# Patient Record
Sex: Female | Born: 2000 | Hispanic: Yes | Marital: Single | Smoking: Never smoker
Health system: Southern US, Community
[De-identification: ages and names within clinical notes are randomized; demographics above are authoritative.]

## PROBLEM LIST (undated history)

## (undated) DIAGNOSIS — F32A Depression, unspecified: Secondary | ICD-10-CM

## (undated) DIAGNOSIS — F909 Attention-deficit hyperactivity disorder, unspecified type: Secondary | ICD-10-CM

## (undated) DIAGNOSIS — T7840XA Allergy, unspecified, initial encounter: Secondary | ICD-10-CM

## (undated) DIAGNOSIS — F913 Oppositional defiant disorder: Secondary | ICD-10-CM

## (undated) DIAGNOSIS — N390 Urinary tract infection, site not specified: Secondary | ICD-10-CM

## (undated) DIAGNOSIS — F419 Anxiety disorder, unspecified: Secondary | ICD-10-CM

## (undated) DIAGNOSIS — J189 Pneumonia, unspecified organism: Secondary | ICD-10-CM

## (undated) DIAGNOSIS — J45909 Unspecified asthma, uncomplicated: Secondary | ICD-10-CM

## (undated) DIAGNOSIS — H539 Unspecified visual disturbance: Secondary | ICD-10-CM

## (undated) DIAGNOSIS — L309 Dermatitis, unspecified: Secondary | ICD-10-CM

## (undated) DIAGNOSIS — F4310 Post-traumatic stress disorder, unspecified: Secondary | ICD-10-CM

## (undated) DIAGNOSIS — R4689 Other symptoms and signs involving appearance and behavior: Secondary | ICD-10-CM

## (undated) HISTORY — DX: Dermatitis, unspecified: L30.9

## (undated) HISTORY — PX: TYMPANOSTOMY TUBE PLACEMENT: SHX32

## (undated) HISTORY — DX: Depression, unspecified: F32.A

## (undated) HISTORY — PX: ADENOIDECTOMY: SUR15

---
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
## 2021-02-21 ENCOUNTER — Other Ambulatory Visit: Payer: Self-pay | Admitting: Pediatrics

## 2021-02-21 DIAGNOSIS — L2084 Intrinsic (allergic) eczema: Secondary | ICD-10-CM

## 2021-02-22 ENCOUNTER — Encounter (HOSPITAL_COMMUNITY): Payer: Self-pay

## 2021-02-22 ENCOUNTER — Ambulatory Visit (HOSPITAL_COMMUNITY)
Admission: RE | Admit: 2021-02-22 | Discharge: 2021-02-22 | Disposition: A | Discharge status: Home / Self Care | Payer: Medicaid Other | Source: Ambulatory Visit | Attending: Family Medicine | Admitting: Family Medicine

## 2021-02-22 ENCOUNTER — Other Ambulatory Visit: Payer: Self-pay

## 2021-02-22 VITALS — BP 120/71 | HR 96 | Temp 99.4°F | Resp 18

## 2021-02-22 DIAGNOSIS — N939 Abnormal uterine and vaginal bleeding, unspecified: Secondary | ICD-10-CM | POA: Diagnosis not present

## 2021-02-22 LAB — POC URINE PREG, ED: Preg Test, Ur: NEGATIVE

## 2021-02-22 NOTE — ED Provider Notes (Signed)
[place]    CSN: [id number] Arrival date & time: 02/22/21  1903      History   Chief Complaint Chief Complaint  Patient presents with   Vaginal Bleeding    HPI Ariel Hernandez is a 21 y.o. female.    Vaginal Bleeding Here for 3 weeks of vaginal bleeding.  She had had her period start when she was still on the hormone pills in her pill pack.  She went 2 days without bleeding and then restarted.  She has been changing her pad 7 times a day or so.  She has felt dizzy a few times.  No syncope.  No fever or chills.  She has had some cramps in her lower abdomen.  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      Home Medications    Prior to Admission medications   Medication Sig Start Date End Date  Taking? Authorizing Provider  albuterol (PROAIR HFA) 108 (90 Base) MCG/ACT inhaler Inhale 2 puffs into the lungs every 4 (four) hours as needed (cough). 05/17/20   Mannie Stabile, MD  albuterol (PROVENTIL) (2.5 MG/3ML) 0.083% nebulizer solution Take 3 mLs (2.5 mg total) by nebulization every 4 (four) hours as needed for wheezing or shortness of breath. 06/04/20   Mannie Stabile, MD  busPIRone (BUSPAR) 15 MG tablet Take 15 mg by mouth 2 (two) times daily. 02/01/19   [provider]  cetirizine (ZYRTEC ALLERGY) 10 MG tablet Take 1 tablet (10 mg total) by mouth daily. 12/06/20   Jaynee Eagles, PA-C  ciprofloxacin-dexamethasone (CIPRODEX) OTIC suspension Place 4 drops into the right ear 2 (two) times daily. 12/06/20   Jaynee Eagles, PA-C  fluticasone (FLONASE) 50 MCG/ACT nasal spray Place 1 spray into both nostrils daily. 05/17/20   Mannie Stabile, MD  fluticasone (FLOVENT HFA) 110 MCG/ACT inhaler Inhale 2 puffs into the lungs 2 (two) times daily. USE WITH SPACER. 05/17/20   Mannie Stabile, MD  LAMICTAL 100 MG tablet Take 100 mg by mouth 2 (two) times daily. 02/01/19   [provider]  Levonorgestrel-Ethinyl Estradiol (CAMRESE) 0.15-0.03 &0.01 MG tablet Take 1 tablet by mouth daily. 05/10/20   Mannie Stabile, MD  loratadine (CLARITIN)  10 MG tablet Take 1 tablet (10 mg total) by mouth daily. 05/17/20   Mannie Stabile, MD  MIRALAX 17 GM/SCOOP powder Take 17 g by mouth daily. 02/01/19   [provider]  montelukast (SINGULAIR) 10 MG tablet Take 1 tablet (10 mg total) by mouth at bedtime. 05/17/20   Mannie Stabile, MD  pseudoephedrine (SUDAFED) 60 MG tablet Take 1 tablet (60 mg total) by mouth every 8 (eight) hours as needed for congestion. 12/06/20   Jaynee Eagles, PA-C  QUEtiapine (SEROQUEL) 200 MG tablet Take 200 mg by mouth in the morning.    [provider]  QUEtiapine (SEROQUEL) 400 MG tablet Take 400 mg by mouth at bedtime.    [provider]  sertraline  (ZOLOFT) 100 MG tablet Take 100 mg by mouth at bedtime.  12/11/15   [provider]  triamcinolone (KENALOG) 0.1 % APPLY TO AFFECTED AREA TWICE A DAY 01/02/20   Mannie Stabile, MD    Family History Family History  Adopted: Yes    Social History Social History   Tobacco Use   Smoking status: Never   Smokeless tobacco: Never  Vaping Use   Vaping Use: Never used  Substance Use Topics   Alcohol use: No   Drug use: No     Allergies   Fish allergy, Focalin [dexmethylphenidate], Hydrocortisone, and Other   Review of Systems Review of Systems  Genitourinary:  Positive for vaginal bleeding.    Physical Exam Triage Vital Signs ED Triage Vitals  Enc Vitals Group     BP 02/22/21 1931 120/71     Pulse Rate 02/22/21 1931 96     Resp 02/22/21 1931 18     Temp 02/22/21 1931 99.4 F (37.4 C)     Temp Source 02/22/21 1931 Oral     SpO2 02/22/21 1931 98 %     Weight --      Height --      Head Circumference --      Peak Flow --      Pain Score 02/22/21 1928 0     Pain Loc --      Pain Edu? --      Excl. in [place]? --    No data found.  Updated Vital Signs BP 120/71 (BP Location: Right Arm)    Pulse 96    Temp 99.4 F (37.4 C) (Oral)    Resp 18    LMP 01/31/2021 Comment: Abnormal bleeding 3 weeks   SpO2 98%   Visual Acuity Right Eye Distance:   Left Eye Distance:   Bilateral Distance:    Right Eye Near:   Left Eye Near:    Bilateral Near:     Physical Exam Vitals reviewed.  Constitutional:      General: She is not in acute distress.    Appearance: She is not toxic-appearing.  HENT:     Nose: Nose normal.     Mouth/Throat:     Mouth: Mucous membranes are moist.  Eyes:     Extraocular Movements: Extraocular movements intact.     Pupils: Pupils are equal, round, and reactive to light.  Cardiovascular:     Rate and Rhythm: Normal rate and regular rhythm.     Heart sounds: No murmur heard. Pulmonary:     Effort: Pulmonary effort is normal.     Breath  sounds: Normal breath sounds.  Abdominal:     Palpations: Abdomen is soft. There is no mass.  Tenderness: There is no abdominal tenderness.  Musculoskeletal:     Cervical back: Neck supple.  Skin:    Capillary Refill: Capillary refill takes less than 2 seconds.     Coloration: Skin is not jaundiced or pale.  Neurological:     General: No focal deficit present.  Psychiatric:        Behavior: Behavior normal.     UC Treatments / Results  Labs (all labs ordered are listed, but only abnormal results are displayed) Labs Reviewed  POC URINE PREG, ED    EKG   Radiology No results found.  Procedures Procedures (including critical care time)  Medications Ordered in UC Medications - No data to display  Initial Impression / Assessment and Plan / UC Course  I have reviewed the triage vital signs and the nursing notes.  Pertinent labs & imaging results that were available during my care of the patient were reviewed by me and considered in my medical decision making (see chart for details).     UPT is negative. Final Clinical Impressions(s) / UC Diagnoses   Final diagnoses:  None   Discharge Instructions   None    ED Prescriptions   None    PDMP not reviewed this encounter.   Barrett Henle, MD 02/22/21 2045

## 2021-02-22 NOTE — ED Triage Notes (Signed)
Pt presents with 3 weeks of abnormal vaginal bleeding.

## 2021-02-22 NOTE — Discharge Instructions (Addendum)
The urine pregnancy test was negative. Take a multivitamin with iron daily.  Make sure you are drinking enough fluids.

## 2021-03-10 ENCOUNTER — Encounter (HOSPITAL_COMMUNITY): Payer: Self-pay

## 2021-03-10 ENCOUNTER — Ambulatory Visit (HOSPITAL_COMMUNITY)
Admission: RE | Admit: 2021-03-10 | Discharge: 2021-03-10 | Disposition: A | Discharge status: Home / Self Care | Payer: Medicaid Other | Source: Ambulatory Visit | Attending: Emergency Medicine | Admitting: Emergency Medicine

## 2021-03-10 ENCOUNTER — Other Ambulatory Visit: Payer: Self-pay

## 2021-03-10 VITALS — BP 117/69 | HR 103 | Temp 98.1°F | Resp 20

## 2021-03-10 DIAGNOSIS — L309 Dermatitis, unspecified: Secondary | ICD-10-CM

## 2021-03-10 DIAGNOSIS — L2084 Intrinsic (allergic) eczema: Secondary | ICD-10-CM

## 2021-03-10 DIAGNOSIS — H66001 Acute suppurative otitis media without spontaneous rupture of ear drum, right ear: Secondary | ICD-10-CM | POA: Diagnosis not present

## 2021-03-10 DIAGNOSIS — B9689 Other specified bacterial agents as the cause of diseases classified elsewhere: Secondary | ICD-10-CM

## 2021-03-10 DIAGNOSIS — J020 Streptococcal pharyngitis: Secondary | ICD-10-CM

## 2021-03-10 DIAGNOSIS — J038 Acute tonsillitis due to other specified organisms: Secondary | ICD-10-CM | POA: Diagnosis not present

## 2021-03-10 MED ORDER — TRIAMCINOLONE ACETONIDE 0.1 % EX CREA: TOPICAL_CREAM | CUTANEOUS | 0 refills | Status: DC

## 2021-03-10 MED ORDER — CEFDINIR 300 MG PO CAPS: 300.0000 mg | ORAL_CAPSULE | Freq: Two times a day (BID) | ORAL | 0 refills | Status: AC

## 2021-03-10 NOTE — ED Provider Notes (Signed)
[place]    CSN: [id number] Arrival date & time: 03/10/21  1300    HISTORY   Chief Complaint  Patient presents with   appt 1   Sore Throat   Otalgia   HPI Ariel Hernandez is a 21 y.o. female. Patient complains of a 10-day history of sore throat and some right ear pain as well.  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  Patient states she has not noticed any improvement of her sore throat, denies difficulty swallowing but states that it is painful.  Patient is afebrile on arrival with a mildly elevated heart rate.  Patient reports a history of strep throat in the past.  Patient denies drainage from her right ear and hearing loss.  Patient states the ear hurts more when she tripped attempts to blow her nose but denies significant nasal congestion.  Patient denies cough.  Patient states that 8 days ago, she vomited once.  Patient states she has not had a headache.  The history is provided by the patient.  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    Home Medications    Prior to Admission medications   Medication Sig Start Date End Date Taking? Authorizing Provider  albuterol (PROAIR HFA) 108 (90 Base) MCG/ACT inhaler Inhale 2 puffs into the lungs every 4 (four) hours as needed (cough). 05/17/20   Mannie Stabile, MD  albuterol (PROVENTIL) (2.5 MG/3ML) 0.083% nebulizer solution Take 3 mLs (2.5 mg total) by nebulization every 4 (four) hours as needed for wheezing or shortness of breath. 06/04/20   Mannie Stabile, MD  busPIRone (BUSPAR) 15 MG tablet Take 15 mg by mouth 2 (two) times daily. 02/01/19   [provider]  cetirizine (ZYRTEC ALLERGY) 10 MG tablet Take 1 tablet (10 mg total) by mouth daily. 12/06/20   Jaynee Eagles, PA-C  fluticasone (FLONASE) 50 MCG/ACT nasal spray Place 1 spray into both nostrils daily. 05/17/20   Mannie Stabile, MD  fluticasone (FLOVENT HFA) 110 MCG/ACT inhaler Inhale 2 puffs into the lungs 2 (two) times daily. USE WITH SPACER. 05/17/20   Mannie Stabile, MD  LAMICTAL 100 MG tablet Take 100 mg by mouth 2 (two) times  daily. 02/01/19   [provider]  Levonorgestrel-Ethinyl Estradiol (CAMRESE) 0.15-0.03 &0.01 MG tablet Take 1 tablet by mouth daily. 05/10/20   Mannie Stabile, MD  loratadine (CLARITIN) 10 MG tablet Take 1 tablet (10 mg total) by mouth daily. 05/17/20   Mannie Stabile, MD  MIRALAX 17 GM/SCOOP powder Take 17 g by mouth daily. 02/01/19   [provider]  montelukast (SINGULAIR) 10 MG tablet Take 1 tablet (10 mg total) by mouth at bedtime. 05/17/20   Mannie Stabile, MD  pseudoephedrine (SUDAFED) 60 MG tablet Take 1 tablet (60 mg total) by mouth every 8 (eight) hours as needed for congestion. 12/06/20    Jaynee Eagles, PA-C  QUEtiapine (SEROQUEL) 200 MG tablet Take 200 mg by mouth in the morning.    [provider]  QUEtiapine (SEROQUEL) 400 MG tablet Take 400 mg by mouth at bedtime.    [provider]  sertraline (ZOLOFT) 100 MG tablet Take 100 mg by mouth at bedtime.  12/11/15   [provider]  triamcinolone (KENALOG) 0.1 % APPLY TO AFFECTED AREA TWICE A DAY 01/02/20   Mannie Stabile, MD   Family History Family History  Adopted: Yes   Social History Social History   Tobacco Use   Smoking status: Never   Smokeless tobacco: Never  Vaping Use   Vaping Use: Never used  Substance Use Topics   Alcohol use: No   Drug use: No   Allergies   Fish allergy, Focalin [dexmethylphenidate], Hydrocortisone, and Other  Review of Systems Review of Systems Pertinent findings noted in history of present illness.   Physical Exam Triage Vital Signs ED Triage Vitals  Enc Vitals Group     BP 11/16/20 0827 (!) 147/82     Pulse Rate 11/16/20 0827 72     Resp 11/16/20 0827 18     Temp 11/16/20 0827 98.3 F (36.8 C)     Temp Source 11/16/20 0827 Oral     SpO2 11/16/20 0827 98 %     Weight --      Height --      Head Circumference --      Peak Flow --      Pain Score 11/16/20 0826 5     Pain Loc --      Pain Edu? --      Excl. in [place]? --   No data found.  Updated Vital Signs BP 117/69 (BP Location: Left Arm)    Pulse (!) 103    Temp 98.1 F (36.7 C) (Oral)    Resp 20    SpO2 99%   Physical Exam Constitutional:      General: She is not in acute distress.    Appearance: She is well-developed. She is ill-appearing. She is not toxic-appearing.  HENT:     Head: Normocephalic and atraumatic.     Salivary Glands: Right salivary gland is diffusely enlarged and tender. Left salivary gland is diffusely enlarged and tender.     Right Ear: Hearing and external ear normal.     Left Ear: Hearing and external ear normal.     Ears:     Comments: Bilateral EACs with mild  erythema, left TM is normal in appearance, right TM bulging with purulent fluid.    Nose: No mucosal edema, congestion or rhinorrhea.     Right Turbinates: Not enlarged, swollen or pale.     Left Turbinates: Not enlarged or swollen.     Right Sinus: No maxillary  sinus tenderness or frontal sinus tenderness.     Left Sinus: No maxillary sinus tenderness or frontal sinus tenderness.     Mouth/Throat:     Lips: Pink. No lesions.     Mouth: Mucous membranes are moist. No oral lesions or angioedema.     Dentition: No gingival swelling.     Tongue: No lesions.     Palate: No mass.     Pharynx: Uvula midline. Pharyngeal swelling, oropharyngeal exudate and posterior oropharyngeal erythema present. No uvula swelling.     Tonsils: Tonsillar exudate present. 2+ on the right. 2+ on the left.  Eyes:     Extraocular Movements: Extraocular movements intact.     Conjunctiva/sclera: Conjunctivae normal.     Pupils: Pupils are equal, round, and reactive to light.  Neck:     Thyroid: No thyroid mass, thyromegaly or thyroid tenderness.     Trachea: Tracheal tenderness present. No abnormal tracheal secretions or tracheal deviation.     Comments: Voice is muffled Cardiovascular:     Rate and Rhythm: Normal rate and regular rhythm.     Pulses: Normal pulses.     Heart sounds: Normal heart sounds, S1 normal and S2 normal. No murmur heard.   No friction rub. No gallop.  Pulmonary:     Effort: Pulmonary effort is normal. No accessory muscle usage, prolonged expiration, respiratory distress or retractions.     Breath sounds: No stridor, decreased air movement or transmitted upper airway sounds. No decreased breath sounds, wheezing, rhonchi or rales.  Abdominal:     General: Bowel sounds are normal.     Palpations: Abdomen is soft.     Tenderness: There is generalized abdominal tenderness. There is no right CVA tenderness, left CVA tenderness or rebound. Negative signs include Murphy's sign.     Hernia: No  hernia is present.  Musculoskeletal:        General: No tenderness. Normal range of motion.     Cervical back: Full passive range of motion without pain, normal range of motion and neck supple.     Right lower leg: No edema.     Left lower leg: No edema.  Lymphadenopathy:     Cervical: Cervical adenopathy present.     Right cervical: Superficial cervical adenopathy present.     Left cervical: Superficial cervical adenopathy present.  Skin:    General: Skin is warm and dry.     Findings: No erythema, lesion or rash.  Neurological:     General: No focal deficit present.     Mental Status: She is alert and oriented to person, place, and time. Mental status is at baseline.  Psychiatric:        Mood and Affect: Mood normal.        Behavior: Behavior normal.        Thought Content: Thought content normal.        Judgment: Judgment normal.    Visual Acuity Right Eye Distance:   Left Eye Distance:   Bilateral Distance:    Right Eye Near:   Left Eye Near:    Bilateral Near:     UC Couse / Diagnostics / Procedures:    EKG  Radiology No results found.  Procedures Procedures (including critical care time)  UC Diagnoses / Final Clinical Impressions(s)   I have reviewed the triage vital signs and the nursing notes.  Pertinent labs & imaging results that were available during my care of the patient were reviewed by me and considered in  my medical decision making (see chart for details).   Final diagnoses:  Acute bacterial tonsillitis  Acute suppurative otitis media of right ear  Eczema, unspecified type  Streptococcal pharyngitis   Patient to begin cefdinir twice daily for 10 days.  Return precautions advised.  ED Prescriptions     Medication Sig Dispense Auth. Provider   triamcinolone cream (KENALOG) 0.1 % APPLY TO AFFECTED AREA TWICE A DAY 453.6 g Lynden Oxford Scales, PA-C   cefdinir (OMNICEF) 300 MG capsule Take 1 capsule (300 mg total) by mouth 2 (two) times daily  for 10 days. 20 capsule Lynden Oxford Scales, PA-C      PDMP not reviewed this encounter.  Pending results:  Labs Reviewed  POCT RAPID STREP A, ED / UC    Medications Ordered in UC: Medications - No data to display  Disposition Upon Discharge:  Condition: stable for discharge home Home: take medications as prescribed; routine discharge instructions as discussed; follow up as advised.  Patient presented with an acute illness with associated systemic symptoms and significant discomfort requiring urgent management. In my opinion, this is a condition that a prudent lay person (someone who possesses an average knowledge of health and medicine) may potentially expect to result in complications if not addressed urgently such as respiratory distress, impairment of bodily function or dysfunction of bodily organs.   Routine symptom specific, illness specific and/or disease specific instructions were discussed with the patient and/or caregiver at length.   As such, the patient has been evaluated and assessed, work-up was performed and treatment was provided in alignment with urgent care protocols and evidence based medicine.  Patient/parent/caregiver has been advised that the patient may require follow up for further testing and treatment if the symptoms continue in spite of treatment, as clinically indicated and appropriate.  If the patient was tested for COVID-19, Influenza and/or RSV, then the patient/parent/guardian was advised to isolate at home pending the results of his/her diagnostic coronavirus test and potentially longer if theyre positive. I have also advised pt that if his/her COVID-19 test returns positive, it's recommended to self-isolate for at least 10 days after symptoms first appeared AND until fever-free for 24 hours without fever reducer AND other symptoms have improved or resolved. Discussed self-isolation recommendations as well as instructions for household member/close  contacts as per the [facility] and [place] [facility], and also gave patient the [place] packet with this information.  Patient/parent/caregiver has been advised to return to the [facility] or PCP in 3-5 days if no better; to PCP or the Emergency Department if new signs and symptoms develop, or if the current signs or symptoms continue to change or worsen for further workup, evaluation and treatment as clinically indicated and appropriate  The patient will follow up with their current PCP if and as advised. If the patient does not currently have a PCP we will assist them in obtaining one.   The patient may need specialty follow up if the symptoms continue, in spite of conservative treatment and management, for further workup, evaluation, consultation and treatment as clinically indicated and appropriate.  Patient/parent/caregiver verbalized understanding and agreement of plan as discussed.  All questions were addressed during visit.  Please see discharge instructions below for further details of plan.  Discharge Instructions:   Discharge Instructions      Your rapid strep test today is positive. Because you also have an infected right eardrum, please begin cefdinir 300 mg twice daily for 10 days today.  A prescription has been sent  to your pharmacy.  Cefdinir will also treat you for bacterial pharyngitis including streptococcal pharyngitis.  Please take all doses as prescribed.    After taking antibiotics for 24 hours, you are no longer considered contagious and you should begin to feel much better.  Please follow-up with your primary care provider in the next week to ten days for repeat evaluation if you have not had complete resolution of your symptoms.   Conservative care is also recommended at this time.  This includes rest, pushing clear fluids and activity as tolerated.  Warm beverages such as teas and broths versus cold beverages/popsicles and frozen sherbet/sorbet are your choice, both warm and cold are  beneficial.  You may also notice that your appetite is reduced; this is okay as long as you are drinking plenty of clear fluids.   I have renewed your prescription for triamcinolone in the chart.  If you would like to mix it 50-50 with Eucerin original healing cream, please do so in the sterile cup provided with the sterile popsicle sticks that I gave you as well.   Please follow-up within the next 3 to 5 days either with your primary care provider or urgent care if your symptoms do not resolve.  If you do not have a primary care provider, we will assist you in finding one.       This office note has been dictated using Museum/gallery curator.  Unfortunately, and despite my best efforts, this method of dictation can sometimes lead to occasional typographical or grammatical errors.  I apologize in advance if this occurs.     Lynden Oxford Scales, PA-C 03/10/21 1515

## 2021-03-10 NOTE — ED Triage Notes (Signed)
Pt had sore throat, sore ears and some congestion for over a week. Reports right ear hurts worse than left.

## 2021-03-10 NOTE — Discharge Instructions (Addendum)
Your rapid strep test today is positive. Because you also have an infected right eardrum, please begin cefdinir 300 mg twice daily for 10 days today.  A prescription has been sent to your pharmacy.  Cefdinir will also treat you for bacterial pharyngitis including streptococcal pharyngitis.  Please take all doses as prescribed.    After taking antibiotics for 24 hours, you are no longer considered contagious and you should begin to feel much better.  Please follow-up with your primary care provider in the next week to ten days for repeat evaluation if you have not had complete resolution of your symptoms.   Conservative care is also recommended at this time.  This includes rest, pushing clear fluids and activity as tolerated.  Warm beverages such as teas and broths versus cold beverages/popsicles and frozen sherbet/sorbet are your choice, both warm and cold are beneficial.  You may also notice that your appetite is reduced; this is okay as long as you are drinking plenty of clear fluids.   I have renewed your prescription for triamcinolone in the chart.  If you would like to mix it 50-50 with Eucerin original healing cream, please do so in the sterile cup provided with the sterile popsicle sticks that I gave you as well.   Please follow-up within the next 3 to 5 days either with your primary care provider or urgent care if your symptoms do not resolve.  If you do not have a primary care provider, we will assist you in finding one.

## 2021-07-24 ENCOUNTER — Ambulatory Visit (INDEPENDENT_AMBULATORY_CARE_PROVIDER_SITE_OTHER): Payer: Medicare Other | Admitting: Student in an Organized Health Care Education/Training Program

## 2021-07-24 ENCOUNTER — Ambulatory Visit (INDEPENDENT_AMBULATORY_CARE_PROVIDER_SITE_OTHER): Payer: Medicare Other | Admitting: Clinical

## 2021-07-24 DIAGNOSIS — F319 Bipolar disorder, unspecified: Secondary | ICD-10-CM

## 2021-07-24 DIAGNOSIS — F4325 Adjustment disorder with mixed disturbance of emotions and conduct: Secondary | ICD-10-CM | POA: Diagnosis not present

## 2021-07-24 MED ORDER — LAMICTAL 100 MG PO TABS: 100.0000 mg | ORAL_TABLET | Freq: Two times a day (BID) | ORAL | 2 refills | Status: DC

## 2021-07-24 MED ORDER — BUSPIRONE HCL 15 MG PO TABS: 15.0000 mg | ORAL_TABLET | Freq: Every day | ORAL | 2 refills | Status: DC

## 2021-07-24 MED ORDER — SERTRALINE HCL 50 MG PO TABS: 50.0000 mg | ORAL_TABLET | Freq: Every day | ORAL | 2 refills | Status: DC

## 2021-07-24 MED ORDER — BUSPIRONE HCL 30 MG PO TABS: 30.0000 mg | ORAL_TABLET | Freq: Every day | ORAL | 2 refills | Status: DC

## 2021-07-24 MED ORDER — QUETIAPINE FUMARATE 200 MG PO TABS: 200.0000 mg | ORAL_TABLET | Freq: Every day | ORAL | 2 refills | Status: DC
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
## 2021-08-08 DIAGNOSIS — F3110 Bipolar disorder, current episode manic without psychotic features, unspecified: Secondary | ICD-10-CM | POA: Insufficient documentation

## 2021-08-08 DIAGNOSIS — F39 Unspecified mood [affective] disorder: Secondary | ICD-10-CM | POA: Insufficient documentation

## 2021-08-08 DIAGNOSIS — F4310 Post-traumatic stress disorder, unspecified: Secondary | ICD-10-CM | POA: Insufficient documentation

## 2021-08-20 ENCOUNTER — Encounter (HOSPITAL_COMMUNITY): Payer: Self-pay

## 2021-08-20 NOTE — Plan of Care (Signed)
  Problem: Depression CCP Problem  1  Goal: LTG: Scott WILL SCORE LESS THAN 10 ON THE PATIENT HEALTH QUESTIONNAIRE (PHQ-9) Outcome: Initial Goal: STG: Reduce overall depression score by a minimum of 25% on the Patient Health Questionnaire (PHQ-9) or the Montgomery-Asberg Depression Rating Scale (MADRS) Outcome: Initial

## 2021-08-26 ENCOUNTER — Encounter (HOSPITAL_COMMUNITY): Payer: Self-pay | Admitting: Student in an Organized Health Care Education/Training Program

## 2021-08-26 ENCOUNTER — Ambulatory Visit (INDEPENDENT_AMBULATORY_CARE_PROVIDER_SITE_OTHER): Payer: Medicare Other | Admitting: Student in an Organized Health Care Education/Training Program

## 2021-08-26 VITALS — BP 117/79 | HR 110 | Resp 92 | Wt 137.2 lb

## 2021-08-26 DIAGNOSIS — F319 Bipolar disorder, unspecified: Secondary | ICD-10-CM

## 2021-08-26 DIAGNOSIS — F4325 Adjustment disorder with mixed disturbance of emotions and conduct: Secondary | ICD-10-CM | POA: Diagnosis not present

## 2021-08-26 MED ORDER — QUETIAPINE FUMARATE 400 MG PO TABS: 400.0000 mg | ORAL_TABLET | Freq: Every day | ORAL | 0 refills | Status: DC

## 2021-08-26 MED ORDER — QUETIAPINE FUMARATE 100 MG PO TABS: 100.0000 mg | ORAL_TABLET | Freq: Two times a day (BID) | ORAL | 0 refills | Status: DC

## 2021-08-26 MED ORDER — BUSPIRONE HCL 15 MG PO TABS: 15.0000 mg | ORAL_TABLET | Freq: Every day | ORAL | 2 refills | Status: DC

## 2021-08-26 MED ORDER — LAMICTAL 100 MG PO TABS: 100.0000 mg | ORAL_TABLET | Freq: Two times a day (BID) | ORAL | 2 refills | Status: DC

## 2021-08-26 MED ORDER — QUETIAPINE FUMARATE 100 MG PO TABS: 100.0000 mg | ORAL_TABLET | Freq: Every day | ORAL | 0 refills | Status: DC

## 2021-08-26 MED ORDER — BUSPIRONE HCL 30 MG PO TABS: 30.0000 mg | ORAL_TABLET | Freq: Every day | ORAL | 2 refills | Status: DC

## 2021-08-26 NOTE — Progress Notes (Signed)
BH MD/PA/NP OP Progress Note  08/26/2021 3:25 PM Ariel Hernandez  MRN:  [id number]  Chief Complaint:  Chief Complaint  Patient presents with   Medication Refill   HPI: Ariel Hernandez is a 21 year old patient with a PPH of ADHD, anxiety, insomnia, intermittent explosive disorder, ODD, intellectual disability and bipolar disorder.  Patient reports she has been doing fairly well on a regimen of: Zoloft 50 mg, BuSpar 15 mg in the a.m./30 mg nightly, Seroquel 200 mg nightly, Lamictal 100 mg twice daily.  Patient reports that she is still having a bit of trouble falling asleep on this regimen endorsing that despite practicing good sleep hygiene, she still feels that her mind is preoccupied with numerous thoughts.  Patient reports that she feels that she may be worrying about a lot of things throughout the day.  Patient reports she is still feeling a bit overwhelmed by being her own guardian and she is still trying to get adjusted to this.  Patient reports she is trying to do her best to make sure that she remains stable.  Patient reports that she has seen a PCP to reestablish primary care.  Patient reports her appetite has been stable and that her PCP is addressing some gastrointestinal issues.  Patient reports that she is on disability and has been since childhood and qualified for SSI when she turned 19.  Patient reports that despite this she is looking forward to hearing back from drug rehabilitation so she can start working in some capacity.  Patient denies SI, HI and AVH. Patient did inform provider that she will be starting a new OCP and there was concern about starting this with her Lamictal.  Provider endorsed the patient should continue her Lamictal as it is. Visit Diagnosis:    ICD-10-CM   1. Bipolar affective disorder, remission status unspecified (HCC)  F31.9 busPIRone (BUSPAR) 15 MG tablet    busPIRone (BUSPAR) 30 MG tablet    LAMICTAL 100 MG tablet    QUEtiapine (SEROQUEL) 400 MG  tablet    QUEtiapine (SEROQUEL) 100 MG tablet    DISCONTINUED: QUEtiapine (SEROQUEL) 100 MG tablet    2. Adjustment disorder with mixed disturbance of emotions and conduct  F43.25 busPIRone (BUSPAR) 30 MG tablet      Past Psychiatric History:  Intellectual disability, intermittent explosive disorder, conduct disorder, oppositional defiant disorder, generalized anxiety disorder, adjustment disorder, bipolar disorder unspecified Patient has had prior outpatient psychiatric care with Lovina Reach in [facility] for most of her childhood, was also seen at [facility] at some point - Prior medications include clonidine 0.3 mg (for nightmares) along with current medication regimen, Seroquel 200 mg daily and 400 mg nightly, Lamictal 100 twice daily, Zoloft 100 mg (recently decreased to 50 mg), BuSpar 15 mg 3 times daily. - Patient was in appear TF in 2019-2020    Past Medical History:  Past Medical History:  Diagnosis Date   ADHD (attention deficit hyperactivity disorder)    Allergy    Seasonal and animals   Anxiety    Asthma    Depression    Phreesia 05/17/2020   Oppositional defiant behavior    Pneumonia    PTSD (post-traumatic stress disorder)    Urinary tract infection    hx;of only once   Vision abnormalities    Hx: wears glasses    Past Surgical History:  Procedure Laterality Date   ADENOIDECTOMY     TOOTH EXTRACTION N/A 10/11/2012   Procedure: EXTRACTIONS  TEETH 5, 12, 21, 28 ;  Surgeon: [name], DDS;  Location: [facility] OR;  Service: Oral Surgery;  Laterality: N/A;    Family Psychiatric History: Biological mother: Bipolar disorder and substance use disorder  Family History:  Family History  Adopted: Yes    Social History:  Social History   Socioeconomic History   Marital status: Single    Spouse name: Not on file   Number of children: Not on file   Years of education: Not on file   Highest education level: Not on file  Occupational History   Not on file   Tobacco Use   Smoking status: Never   Smokeless tobacco: Never  Vaping Use   Vaping Use: Never used  Substance and Sexual Activity   Alcohol use: No   Drug use: No   Sexual activity: Never    Comment: Heterosexual  Other Topics Concern   Not on file  Social History Narrative   Not on file   Social Determinants of Health   Financial Resource Strain: Not on file  Food Insecurity: Not on file  Transportation Needs: Not on file  Physical Activity: Not on file  Stress: Not on file  Social Connections: Not on file    Allergies:  Allergies  Allergen Reactions   Fish Allergy    Focalin [Dexmethylphenidate]    Hydrocortisone Rash   Other Diarrhea and Nausea And Vomiting    Fish, milk    Metabolic Disorder Labs: No results found for: "HGBA1C", "MPG" Lab Results  Component Value Date   PROLACTIN 5.5 10/02/2019   Lab Results  Component Value Date   CHOL 213 (H) 10/02/2019   TRIG 187 (H) 10/02/2019   HDL 46 10/02/2019   CHOLHDL 4.6 10/02/2019   VLDL 37 10/02/2019   LDLCALC 130 (H) 10/02/2019   Lab Results  Component Value Date   TSH 2.295 10/02/2019    Therapeutic Level Labs: No results found for: "LITHIUM" No results found for: "VALPROATE" No results found for: "CBMZ"  Current Medications: Current Outpatient Medications  Medication Sig Dispense Refill   QUEtiapine (SEROQUEL) 100 MG tablet Take 1 tablet (100 mg total) by mouth at bedtime. 6 tablet 0   albuterol (PROAIR HFA) 108 (90 Base) MCG/ACT inhaler Inhale 2 puffs into the lungs every 4 (four) hours as needed (cough). 36 g 5   albuterol (PROVENTIL) (2.5 MG/3ML) 0.083% nebulizer solution Take 3 mLs (2.5 mg total) by nebulization every 4 (four) hours as needed for wheezing or shortness of breath. 75 mL 2   busPIRone (BUSPAR) 15 MG tablet Take 1 tablet (15 mg total) by mouth daily. 30 tablet 2   busPIRone (BUSPAR) 30 MG tablet Take 1 tablet (30 mg total) by mouth at bedtime. 30 tablet 2   cetirizine (ZYRTEC  ALLERGY) 10 MG tablet Take 1 tablet (10 mg total) by mouth daily. 30 tablet 0   fluticasone (FLONASE) 50 MCG/ACT nasal spray Place 1 spray into both nostrils daily. 16 mL 11   fluticasone (FLOVENT HFA) 110 MCG/ACT inhaler Inhale 2 puffs into the lungs 2 (two) times daily. USE WITH SPACER. 12 g 5   LAMICTAL 100 MG tablet Take 1 tablet (100 mg total) by mouth 2 (two) times daily. 60 tablet 2   Levonorgestrel-Ethinyl Estradiol (CAMRESE) 0.15-0.03 &0.01 MG tablet Take 1 tablet by mouth daily. 91 tablet 3   loratadine (CLARITIN) 10 MG tablet Take 1 tablet (10 mg total) by mouth daily. 30 tablet 11   MIRALAX 17 GM/SCOOP powder Take 17 g by mouth daily.  montelukast (SINGULAIR) 10 MG tablet Take 1 tablet (10 mg total) by mouth at bedtime. 30 tablet 11   pseudoephedrine (SUDAFED) 60 MG tablet Take 1 tablet (60 mg total) by mouth every 8 (eight) hours as needed for congestion. 30 tablet 0   QUEtiapine (SEROQUEL) 400 MG tablet Take 1 tablet (400 mg total) by mouth at bedtime. 30 tablet 0   sertraline (ZOLOFT) 50 MG tablet Take 1 tablet (50 mg total) by mouth daily. 30 tablet 2   triamcinolone cream (KENALOG) 0.1 % APPLY TO AFFECTED AREA TWICE A DAY 453.6 g 0   No current facility-administered medications for this visit.     Musculoskeletal: Strength & Muscle Tone: within normal limits Gait & Station: normal Patient leans: N/A  Psychiatric Specialty Exam: Review of Systems  Psychiatric/Behavioral:  Positive for sleep disturbance. Negative for agitation, behavioral problems, hallucinations, self-injury and suicidal ideas.     Blood pressure 117/79, pulse (!) 110, resp. rate (!) 92, weight 137 lb 3.2 oz (62.2 kg).Body mass index is 27.7 kg/m.  General Appearance: Casual  Eye Contact:  Good  Speech:  Clear and Coherent  Volume:  Normal  Mood:  Euthymic  Affect:  Appropriate and Congruent  Thought Process:  Goal Directed  Orientation:  Full (Time, Place, and Person)  Thought Content: Logical    Suicidal Thoughts:  No  Homicidal Thoughts:  No  Memory:  Immediate;   Good Recent;   Good Remote;   Fair  Judgement:  Good  Insight:  Fair  Psychomotor Activity:  Normal  Concentration:  Concentration: Fair  Recall:  NA  Fund of Knowledge: Fair  Language: Good  Akathisia:  No    AIMS (if indicated): not done  Assets:  Communication Skills Desire for Improvement Housing Resilience Social Support  ADL's:  Intact  Cognition: WNL  Sleep:  Fair   Screenings: GAD-7    Flowsheet Row Clinical Support from 08/26/2021 in [facility] Counselor from 07/24/2021 in [facility]  Total GAD-7 Score 8 14      PHQ2-9    Flowsheet Row Counselor from 07/24/2021 in [facility] Office Visit from 05/17/2020 in [facility] Office Visit from 05/18/2019 in [facility]  PHQ-2 Total Score 0 0 1  PHQ-9 Total Score 1 2 6       Flowsheet Row ED from 03/10/2021 in [facility] at [facility] ED from 02/22/2021 in [facility] at [facility] ED from 12/06/2020 in [facility] at [facility] RISK CATEGORY No Risk No Risk No Risk        Assessment and Plan: Ariel Hernandez is a 21 year old patient with a PPH of ADHD, anxiety, insomnia, intermittent explosive disorder, ODD, intellectual disability and bipolar disorder.  Patient appears to be doing fairly well however she continues to have some issues with sleep.  Patient was previously on a much higher dose of Seroquel for mood regulation.  Patient also endorses some anxieties related to being her own guardian however she appears to have decent insight.  Patient also has enough foresight to recognize that her medical appointments may be somewhat burdensome and her older sister who transports her.  Patient and provider discussed that patient is currently in a place where she can do video visits, patient endorsed  that she would like to try this in order to help her sister.  Adjustment disorder with anxiety -Continue Zoloft 50 mg daily -  Continue BuSpar 15 mg in the a.m. and 30 mg nightly  Hx bipolar disorder - Increase Seroquel from 200 mg nightly to 300 mg x 5 days (patient reports having five 200 mg pills at home).  Then increase again to 400 mg nightly - Continue Lamictal 100 mg twice daily  Follow-up with patient in approximately 2 months.  Collaboration of Care: Collaboration of Care:   Patient/Guardian was advised Release of Information must be obtained prior to any record release in order to collaborate their care with an outside provider. Patient/Guardian was advised if they have not already done so to contact the registration department to sign all necessary forms in order for [place] to release information regarding their care.   Consent: Patient/Guardian gives verbal consent for treatment and assignment of benefits for services provided during this visit. Patient/Guardian expressed understanding and agreed to proceed.    Bobbye Morton, MD 08/26/2021, 3:25 PM

## 2021-09-19 ENCOUNTER — Encounter (HOSPITAL_COMMUNITY): Payer: Self-pay

## 2021-09-19 ENCOUNTER — Ambulatory Visit (HOSPITAL_COMMUNITY)
Admission: EM | Admit: 2021-09-19 | Discharge: 2021-09-19 | Disposition: A | Discharge status: Home / Self Care | Payer: Medicare Other | Attending: Family Medicine | Admitting: Family Medicine

## 2021-09-19 ENCOUNTER — Ambulatory Visit (INDEPENDENT_AMBULATORY_CARE_PROVIDER_SITE_OTHER): Payer: Medicare Other

## 2021-09-19 DIAGNOSIS — S93602A Unspecified sprain of left foot, initial encounter: Secondary | ICD-10-CM | POA: Diagnosis not present

## 2021-09-19 DIAGNOSIS — M25572 Pain in left ankle and joints of left foot: Secondary | ICD-10-CM

## 2021-09-19 DIAGNOSIS — M79672 Pain in left foot: Secondary | ICD-10-CM

## 2021-09-19 DIAGNOSIS — S93492A Sprain of other ligament of left ankle, initial encounter: Secondary | ICD-10-CM

## 2021-09-19 NOTE — Discharge Instructions (Signed)
If not allergic, you may use over the counter ibuprofen or acetaminophen as needed. ° °

## 2021-09-19 NOTE — ED Provider Notes (Signed)
[facility]   [id number] 09/19/21 Arrival Time: 1909  ASSESSMENT & PLAN:  1. Sprain of anterior talofibular ligament of left ankle, initial encounter   2. Foot sprain, left, initial encounter    I have personally viewed the imaging studies ordered this visit. No bony abnormalities on left foot/ankle films. Discussed with caregiver.  OTC ibuprofen. See AVS for d/c information.  Orders Placed This Encounter  Procedures   DG Foot Complete Left   DG Ankle Complete Left   Recommend:   Follow-up Information     [facility].   Why: If worsening or failing to improve as anticipated. Contact information: [address] [place] [address] [phone]               Reviewed expectations re: course of current medical issues. Questions answered. Outlined signs and symptoms indicating need for more acute intervention. Patient verbalized understanding. After Visit Summary given.  SUBJECTIVE: History from: patient and caregiver. Ariel Hernandez is a 21 y.o. female who reports pain to LEFT foot an ankle; reports stepping in to hole today; twisted; immediate pain, esp with attempted weight-bearing; now with swelling. No extremity sensation changes or weakness. Tylenol PTA; some help.  Past Surgical History:  Procedure Laterality Date   ADENOIDECTOMY     TOOTH EXTRACTION N/A 10/11/2012   Procedure: EXTRACTIONS  TEETH 5, 12, 21, 28 ;  Surgeon: [name], DDS;  Location: [facility] OR;  Service: Oral Surgery;  Laterality: N/A;    OBJECTIVE:  Vitals:   09/19/21 1925  BP: (!) 165/82  Pulse: (!) 112  Resp: 18  Temp: 98.1 F (36.7 C)  SpO2: 97%    Tachycardia noted.  General appearance: alert; no distress HEENT: [place]; AT Neck: supple with FROM Resp: unlabored respirations Extremities: LLE: warm with well perfused appearance; poorly localized moderate tenderness over left lateral ankle and proximal dorsal/lateral  foot; swelling: moderate; bruising: none; ankle ROM: limited by reported pain CV: brisk extremity capillary refill of LLE; 2+ DP pulse of LLE. Skin: warm and dry; no visible rashes Neurologic: will not attempt weight-bearing; normal sensation and strength of LLE Psychological: alert and cooperative; normal mood and affect  Imaging: DG Foot Complete Left  Result Date: 09/19/2021 CLINICAL DATA:  Trauma. EXAM: LEFT FOOT - COMPLETE 3+ VIEW COMPARISON:  None Available. FINDINGS: There is no evidence of fracture or dislocation. There is no evidence of arthropathy or other focal bone abnormality. Soft tissues are unremarkable. IMPRESSION: Negative. Electronically Signed   By: Darliss Cheney M.D.   On: 09/19/2021 19:58   DG Ankle Complete Left  Result Date: 09/19/2021 CLINICAL DATA:  Left ankle pain EXAM: LEFT ANKLE COMPLETE - 3+ VIEW COMPARISON:  None Available. FINDINGS: There is no evidence of fracture, dislocation, or joint effusion. There is no evidence of arthropathy or other focal bone abnormality. Soft tissues are unremarkable. IMPRESSION: Negative. Electronically Signed   By: [name] M.D.   On: 09/19/2021 19:58     Allergies  Allergen Reactions   Fish Allergy    Focalin [Dexmethylphenidate]    Hydrocortisone Rash   Other Diarrhea and Nausea And Vomiting    Fish, milk    Past Medical History:  Diagnosis Date   ADHD (attention deficit hyperactivity disorder)    Allergy    Seasonal and animals   Anxiety    Asthma    Depression    [employer]reesia 05/17/2020   Oppositional defiant behavior    Pneumonia  PTSD (post-traumatic stress disorder)    Urinary tract infection    hx;of only once   Vision abnormalities    Hx: wears glasses   Social History   Socioeconomic History   Marital status: Single    Spouse name: Not on file   Number of children: Not on file   Years of education: Not on file   Highest education level: Not on file  Occupational History   Not on file   Tobacco Use   Smoking status: Never   Smokeless tobacco: Never  Vaping Use   Vaping Use: Never used  Substance and Sexual Activity   Alcohol use: No   Drug use: No   Sexual activity: Never    Birth control/protection: Pill    Comment: Heterosexual  Other Topics Concern   Not on file  Social History Narrative   Not on file   Social Determinants of Health   Financial Resource Strain: Not on file  Food Insecurity: Not on file  Transportation Needs: Not on file  Physical Activity: Not on file  Stress: Not on file  Social Connections: Not on file   Family History  Adopted: Yes   Past Surgical History:  Procedure Laterality Date   ADENOIDECTOMY     TOOTH EXTRACTION N/A 10/11/2012   Procedure: EXTRACTIONS  TEETH 5, 12, 21, 28 ;  Surgeon: [name], DDS;  Location: [facility] OR;  Service: Oral Surgery;  Laterality: N/AMardella Layman, MD 09/19/21 2016

## 2021-09-19 NOTE — ED Triage Notes (Signed)
Pt states stepped wrong in a ditch twisting lt and hurting lt foot. Swelling noted to top of lt foot. Pt states tool tylenol with some relief.

## 2021-10-01 ENCOUNTER — Ambulatory Visit (INDEPENDENT_AMBULATORY_CARE_PROVIDER_SITE_OTHER): Payer: Medicare Other | Admitting: Clinical

## 2021-10-01 DIAGNOSIS — F319 Bipolar disorder, unspecified: Secondary | ICD-10-CM | POA: Diagnosis not present

## 2021-10-01 NOTE — Progress Notes (Signed)
THERAPIST PROGRESS NOTE Virtual Visit via Video Note  I connected with Ariel Hernandez on 10/01/21 at  8:00 AM EDT by a video enabled telemedicine application and verified that I am speaking with the correct person using two identifiers.  Location: Patient: home Provider: office   I discussed the limitations of evaluation and management by telemedicine and the availability of in person appointments. The patient expressed understanding and agreed to proceed.   Follow Up Instructions: I discussed the assessment and treatment plan with the patient. The patient was provided an opportunity to ask questions and all were answered. The patient agreed with the plan and demonstrated an understanding of the instructions.   The patient was advised to call back or seek an in-person evaluation if the symptoms worsen or if the condition fails to improve as anticipated.    Session Time: 40 minutes  Participation Level: Active  Behavioral Response: CasualAlertEuthymic  Type of Therapy: Individual Therapy  Treatment Goals addressed: client will reduce overall depression score by a minimum of 25% on the PHQ9  ProgressTowards Goals: Progressing  Interventions: CBT and Supportive  Summary:  Ariel Hernandez is a 21 y.o. female who presents for the scheduled appointment oriented x5, appropriately dressed, and friendly.  Client denied hallucinations and delusions. Client reported on today she is doing pretty well.  Client reported since she was last seen things have been going well at the house.  Client reported she has been helping with doing chores around the house and with her niece and nephew.  Client reported her sister and brother-in-law have been very supportive in helping her to create structure and independence for herself.  Client reported they are positively reinforcing that 1 day she will be living on her own and knowing how to have a daily routine and maintain employment.  Client  reported her sister is helping her to look for work, teacher what pain client will be like, and creating a daily routine.  Client reported they are also want to help her learn how to make simple meals for herself and she can help cook at least once per week.  Client reported she is expressed some thoughts and feelings about feeling scared learning how to do more adult activities.  Client reported to goals for herself and learning how to reduce worry and improve her self-esteem. Client reported she is still meeting with her post release officer and she is set to be finished with that in March 2024.  Client reported she spends her time at home by herself because her sister and brother-in-law are working she does not have transportation of her own.  Client reported her sister has helped her to connect with vocational rehab to help with career assisted services but it has been a month that they have been reaching out and have not heard back from anyone.  Client reported her sister will help her look at jobs that she can apply for.  Client reported she is responding well to medication management and is eating and sleeping well. Evidence of progress towards goal:  client reported she practice problem solving skills at least 2x per week figuring out how to apply for employment. Client reported she is also working on maintaining a daily routine 7 out of 7 days per week.   Suicidal/Homicidal: Nowithout intent/plan  Therapist Response:  Therapist began the appointment asking the client how she has been doing since last seen. Therapist used CBT to engage using active listening and positive emotional  support. Therapist used CBT to ask the client about how she is managing daily activity on her own. Therapist used CBT to discuss how to maintain a routine and gain independence/ self esteem. Therapist used CBT ask the client to identify her progress with frequency of use with coping skills with continued practice in her  daily activity.    Therapist gave the client community resource information for supportive career services.     Plan: Return again in 4 weeks.  Diagnosis: bipolar affective disorder, remission status unspecified  Collaboration of Care: [employer]) AEB therapist gave the client information to [employer] for career supported service.  Patient/Guardian was advised Release of Information must be obtained prior to any record release in order to collaborate their care with an outside provider. Patient/Guardian was advised if they have not already done so to contact the registration department to sign all necessary forms in order for [place] to release information regarding their care.   Consent: Patient/Guardian gives verbal consent for treatment and assignment of benefits for services provided during this visit. Patient/Guardian expressed understanding and agreed to proceed.   Neena Rhymes Viridiana Spaid, LCSW 10/01/2021

## 2021-10-06 NOTE — Plan of Care (Signed)
  Problem: Depression CCP Problem  1  Goal: LTG: Kana WILL SCORE LESS THAN 10 ON THE PATIENT HEALTH QUESTIONNAIRE (PHQ-9) Outcome: Progressing Goal: STG: Reduce overall depression score by a minimum of 25% on the Patient Health Questionnaire (PHQ-9) or the Montgomery-Asberg Depression Rating Scale (MADRS) Outcome: Progressing

## 2021-10-15 ENCOUNTER — Ambulatory Visit (INDEPENDENT_AMBULATORY_CARE_PROVIDER_SITE_OTHER): Payer: Medicare Other | Admitting: Clinical

## 2021-10-15 DIAGNOSIS — F319 Bipolar disorder, unspecified: Secondary | ICD-10-CM

## 2021-10-15 NOTE — Progress Notes (Signed)
THERAPIST PROGRESS NOTE Virtual Visit via Video Note  I connected with Ariel Hernandez on 10/15/21 at  8:00 AM EDT by a video enabled telemedicine application and verified that I am speaking with the correct person using two identifiers.  Location: Patient: home Provider: office   I discussed the limitations of evaluation and management by telemedicine and the availability of in person appointments. The patient expressed understanding and agreed to proceed.   Follow Up Instructions: I discussed the assessment and treatment plan with the patient. The patient was provided an opportunity to ask questions and all were answered. The patient agreed with the plan and demonstrated an understanding of the instructions.   The patient was advised to call back or seek an in-person evaluation if the symptoms worsen or if the condition fails to improve as anticipated.   Session Time: 35 minutes  Participation Level: Active  Behavioral Response: CasualAlertEuthymic  Type of Therapy: Individual Therapy  Treatment Goals addressed: client will score less than 10 on the PHQ9  ProgressTowards Goals: Progressing  Interventions: CBT and Supportive  Summary:  Ariel Hernandez is a 21 y.o. female who presents for scheduled appointment oriented x5, appropriately dressed, and friendly.  Client denied hallucinations and delusions. Client reported on today she has been doing well.  Client reported she has a very good relationship with her mother and sister-in-law whom she is staying with and she is progressively opening up and talking to him more about things that bother her over she feels insecure about.  Client reported one of the things that she has discussed with them is going to get better at receiving constructive criticism.  Client reported her feelings get easily her and she does not want to be so emotional to receiving feedback.  Client reported otherwise she gave her sister the information to  [facility] from the last session and they will contact them to ask about the different programs.  Client reported being on post release has not limited her in some aspects but she would like some place to go where she can get social interaction and make friends.  Client reported she also wants to work sooner than later.  Client reported in regards to her parents she wants to have a good relationship with them but she feels like they do not want to have one with her own when they do talk to her it does not seem genuine.  Client reported part of her motivation is 1 and improve her parents longer at least 2 do not think that she is capable of doing better.  Client reported her sister told her she has to focus on her own motivation to improve and not showing people they were wrong.  Evidence of progress towards goal: Client reported she stays active by doing chores around the house at least 5 out of 7 days a week.  Client reported she is medication compliant 7 days out of the week.  Client reported 1 do believe that supports depression. Flowsheet Row Counselor from 10/15/2021 in [facility]  PHQ-9 Total Score 6        Suicidal/Homicidal: Nowithout intent/plan  Therapist Response:  Therapist began the appointment asking the client how she has been doing since last seen. Therapist used CBT to engage using active listening and positive emotional support. Therapist used CBT to engage the client and ask her about points of concern that she has been thinking about. Therapist used CBT to discuss cognitive patterns  and reframing thoughts pertaining to receiving constructive criticism from others. Therapist used CBT to reinforce the clients personal goals of working towards employment and independence. Therapist used CBT ask the client to identify her progress with frequency of use with coping skills with continued practice in her daily activity.    Therapist assigned client  homework to practice positive self talk in a way that builds her confidence and motivation to do better for herself.   Plan: Return again in 4 weeks.  Diagnosis: bipolar affective disorder, remission status unspecified  Collaboration of Care: Patient refused AEB none requested by the client at this time.  Patient/Guardian was advised Release of Information must be obtained prior to any record release in order to collaborate their care with an outside provider. Patient/Guardian was advised if they have not already done so to contact the registration department to sign all necessary forms in order for [place] to release information regarding their care.   Consent: Patient/Guardian gives verbal consent for treatment and assignment of benefits for services provided during this visit. Patient/Guardian expressed understanding and agreed to proceed.   [place]bert, LCSW 10/15/2021

## 2021-10-18 NOTE — Plan of Care (Signed)
  Problem: Depression CCP Problem  1  Goal: LTG: [place] WILL SCORE LESS THAN 10 ON THE PATIENT HEALTH QUESTIONNAIRE (PHQ-9) Outcome: Progressing Goal: STG: Reduce overall depression score by a minimum of 25% on the Patient Health Questionnaire (PHQ-9) or the Montgomery-Asberg Depression Rating Scale (MADRS) Outcome: Progressing   

## 2021-10-28 ENCOUNTER — Telehealth (INDEPENDENT_AMBULATORY_CARE_PROVIDER_SITE_OTHER): Payer: Medicare Other | Admitting: Student in an Organized Health Care Education/Training Program

## 2021-10-28 ENCOUNTER — Encounter (HOSPITAL_COMMUNITY): Payer: Self-pay | Admitting: Student in an Organized Health Care Education/Training Program

## 2021-10-28 DIAGNOSIS — F319 Bipolar disorder, unspecified: Secondary | ICD-10-CM

## 2021-10-28 DIAGNOSIS — F4325 Adjustment disorder with mixed disturbance of emotions and conduct: Secondary | ICD-10-CM

## 2021-10-28 MED ORDER — BUSPIRONE HCL 30 MG PO TABS: 30.0000 mg | ORAL_TABLET | Freq: Every day | ORAL | 2 refills | Status: DC

## 2021-10-28 MED ORDER — BUSPIRONE HCL 15 MG PO TABS: 15.0000 mg | ORAL_TABLET | Freq: Every day | ORAL | 2 refills | Status: DC

## 2021-10-28 MED ORDER — LAMICTAL 100 MG PO TABS: 100.0000 mg | ORAL_TABLET | Freq: Two times a day (BID) | ORAL | 2 refills | Status: DC

## 2021-10-28 MED ORDER — QUETIAPINE FUMARATE 400 MG PO TABS: 400.0000 mg | ORAL_TABLET | Freq: Every day | ORAL | 3 refills | Status: DC

## 2021-10-28 MED ORDER — SERTRALINE HCL 50 MG PO TABS: 50.0000 mg | ORAL_TABLET | Freq: Every day | ORAL | 2 refills | Status: DC

## 2021-10-28 NOTE — Progress Notes (Signed)
BH MD/PA/NP OP Progress Note  10/28/2021 4:35 PM Ariel Hernandez  MRN:  [id number]  Chief Complaint:  Chief Complaint  Patient presents with   Follow-up   HPI: Virtual Visit via Video Note  I connected with Ariel Hernandez on 10/28/21 at  3:00 PM EDT by a video enabled telemedicine application and verified that I am speaking with the correct person using two identifiers.  Location: Patient: Home, Alone Provider: Office   I discussed the limitations of evaluation and management by telemedicine and the availability of in person appointments. The patient expressed understanding and agreed to proceed.  History of Present Illness: Ariel Hernandez is a 21 year old patient with a PPH of ADHD, anxiety, insomnia, intermittent explosive disorder, ODD, intellectual disability and bipolar disorder.  Patient reports she is compliant with the following medication regimen:  Seroquel 400 mg nightly Lamictal 100 mg twice daily Zoloft 50 mg daily BuSpar 15 mg in the a.m. and 30 mg nightly  Patient reports she is not having any adverse side effects from her current medication regimen.  Patient reports she continues to live with her sister who is previously her guardian.  Patient reports that they continue to have a good relationship.  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  Patient reports she is also looking to get more social interaction by volunteering.  Patient reports that she still finds it difficult to be her own legal guardian, but ask her sister for help when she needs it.  Patient does endorse that her sister is giving her some independence allowing patient to figure things out on her own, "unless I really need them."  Patient reports that sometimes the task of being her own legal guardian leads to her feeling more anxious however she endorses using her "coping skills" including listening to music when she  becomes anxious.  Patient reports that she feels the Seroquel significantly helps ensure that she sleeps at night however she continues to have occasional nights where it is hard for her to fall and stay asleep.  Patient denies having racing thoughts at these times and endorses that generally she would just feel a bit more tired during the day.  Patient reports she has not been having any issues with irritability and reports that overall her mood has been "good."  Patient reports she does not have any specific concerns and does not believe her sister would have any additional concerns as her sister has told her that she believes she is doing fairly well.  Patient denies SI, HI and AVH.    I discussed the assessment and treatment plan with the patient. The patient was provided an opportunity to ask questions and all were answered. The patient agreed with the plan and demonstrated an understanding of the instructions.   The patient was advised to call back or seek an in-person evaluation if the symptoms worsen or if the condition fails to improve as anticipated.  I provided 20 minutes of non-face-to-face time during this encounter.  PGY-3 Ariel Morton, MD  Visit Diagnosis:    ICD-10-CM   1. Bipolar affective disorder, remission status unspecified (HCC)  F31.9 QUEtiapine (SEROQUEL) 400 MG tablet    LAMICTAL 100 MG tablet    busPIRone (BUSPAR) 15 MG tablet    busPIRone (BUSPAR) 30 MG tablet    sertraline (ZOLOFT) 50 MG tablet    2. Adjustment disorder with mixed disturbance  of emotions and conduct  F43.25 busPIRone (BUSPAR) 30 MG tablet      Past Psychiatric History: Intellectual disability, intermittent explosive disorder, conduct disorder, oppositional defiant disorder, generalized anxiety disorder, adjustment disorder, bipolar disorder unspecified Patient has had prior outpatient psychiatric care with Ariel Hernandez in [facility] for most of her childhood, was also seen at [facility] at  some point - Prior medications include clonidine 0.3 mg (for nightmares) along with current medication regimen, Seroquel 200 mg daily and 400 mg nightly, Lamictal 100 twice daily, Zoloft 100 mg (recently decreased to 50 mg), BuSpar 15 mg 3 times daily. - Patient was in [facility] in 2019-2020   08/2021: Patient Seroquel titrated up from 200 mg to 300 mg and then 400 mg nightly.  All other medications continued at previous dosage.  Patient was having anxiety about being her own guardian and difficulty sleeping, as well as numerous thoughts in her mind that were concerning to her.  Past Medical History:  Past Medical History:  Diagnosis Date   ADHD (attention deficit hyperactivity disorder)    Allergy    Seasonal and animals   Anxiety    Asthma    Depression    Phreesia 05/17/2020   Oppositional defiant behavior    Pneumonia    PTSD (post-traumatic stress disorder)    Urinary tract infection    hx;of only once   Vision abnormalities    Hx: wears glasses    Past Surgical History:  Procedure Laterality Date   ADENOIDECTOMY     TOOTH EXTRACTION N/A 10/11/2012   Procedure: EXTRACTIONS  TEETH 5, 12, 21, 28 ;  Surgeon: Gae Bon, DDS;  Location: [place];  Service: Oral Surgery;  Laterality: N/A;    Family Psychiatric History: Biological mother: Bipolar disorder and substance use disorder  Family History:  Family History  Adopted: Yes    Social History:  Social History   Socioeconomic History   Marital status: Significant Other    Spouse name: Not on file   Number of children: Not on file   Years of education: Not on file   Highest education level: Not on file  Occupational History   Not on file  Tobacco Use   Smoking status: Never   Smokeless tobacco: Never  Vaping Use   Vaping Use: Never used  Substance and Sexual Activity   Alcohol use: No   Drug use: No   Sexual activity: Never    Birth control/protection: Pill    Comment: Heterosexual  Other Topics Concern    Not on file  Social History Narrative   Not on file   Social Determinants of Health   Financial Resource Strain: Not on file  Food Insecurity: Not on file  Transportation Needs: Not on file  Physical Activity: Not on file  Stress: Not on file  Social Connections: Not on file    Allergies:  Allergies  Allergen Reactions   Fish Allergy    Focalin [Dexmethylphenidate]    Hydrocortisone Rash   Other Diarrhea and Nausea And Vomiting    Fish, milk    Metabolic Disorder Labs: No results found for: "HGBA1C", "MPG" Lab Results  Component Value Date   PROLACTIN 5.5 10/02/2019   Lab Results  Component Value Date   CHOL 213 (H) 10/02/2019   TRIG 187 (H) 10/02/2019   HDL 46 10/02/2019   CHOLHDL 4.6 10/02/2019   VLDL 37 10/02/2019   LDLCALC 130 (H) 10/02/2019   Lab Results  Component Value Date  TSH 2.295 10/02/2019    Therapeutic Level Labs: No results found for: "LITHIUM" No results found for: "VALPROATE" No results found for: "CBMZ"  Current Medications: Current Outpatient Medications  Medication Sig Dispense Refill   albuterol (PROAIR HFA) 108 (90 Base) MCG/ACT inhaler Inhale 2 puffs into the lungs every 4 (four) hours as needed (cough). 36 g 5   albuterol (PROVENTIL) (2.5 MG/3ML) 0.083% nebulizer solution Take 3 mLs (2.5 mg total) by nebulization every 4 (four) hours as needed for wheezing or shortness of breath. 75 mL 2   busPIRone (BUSPAR) 15 MG tablet Take 1 tablet (15 mg total) by mouth daily. 30 tablet 2   busPIRone (BUSPAR) 30 MG tablet Take 1 tablet (30 mg total) by mouth at bedtime. 30 tablet 2   cetirizine (ZYRTEC ALLERGY) 10 MG tablet Take 1 tablet (10 mg total) by mouth daily. 30 tablet 0   fluticasone (FLONASE) 50 MCG/ACT nasal spray Place 1 spray into both nostrils daily. 16 mL 11   fluticasone (FLOVENT HFA) 110 MCG/ACT inhaler Inhale 2 puffs into the lungs 2 (two) times daily. USE WITH SPACER. 12 g 5   LAMICTAL 100 MG tablet Take 1 tablet (100 mg  total) by mouth 2 (two) times daily. 60 tablet 2   Levonorgestrel-Ethinyl Estradiol (CAMRESE) 0.15-0.03 &0.01 MG tablet Take 1 tablet by mouth daily. 91 tablet 3   loratadine (CLARITIN) 10 MG tablet Take 1 tablet (10 mg total) by mouth daily. 30 tablet 11   MIRALAX 17 GM/SCOOP powder Take 17 g by mouth daily.     montelukast (SINGULAIR) 10 MG tablet Take 1 tablet (10 mg total) by mouth at bedtime. 30 tablet 11   pseudoephedrine (SUDAFED) 60 MG tablet Take 1 tablet (60 mg total) by mouth every 8 (eight) hours as needed for congestion. 30 tablet 0   QUEtiapine (SEROQUEL) 400 MG tablet Take 1 tablet (400 mg total) by mouth at bedtime. 30 tablet 3   sertraline (ZOLOFT) 50 MG tablet Take 1 tablet (50 mg total) by mouth daily. 30 tablet 2   triamcinolone cream (KENALOG) 0.1 % APPLY TO AFFECTED AREA TWICE A DAY 453.6 g 0   No current facility-administered medications for this visit.     Musculoskeletal: Defer  Psychiatric Specialty Exam: Review of Systems  Psychiatric/Behavioral:  Positive for sleep disturbance. Negative for agitation, behavioral problems, decreased concentration, dysphoric mood, hallucinations and suicidal ideas.     There were no vitals taken for this visit.There is no height or weight on file to calculate BMI.  General Appearance: Casual  Eye Contact:  Fair  Speech:  Clear and Coherent  Volume:  Normal  Mood:  Euthymic  Affect:  Appropriate  Thought Process:  Goal Directed  Orientation:  Full (Time, Place, and Person)  Thought Content: Logical   Suicidal Thoughts:  No  Homicidal Thoughts:  No  Memory:  Immediate;   Good Recent;   Good  Judgement:  Good  Insight:  Good  Psychomotor Activity:  Normal  Concentration:  Concentration: Fair  Recall:  Fair  Fund of Knowledge: Fair  Language: Good  Akathisia:  NA  Handed:    AIMS (if indicated): not done  Assets:  Communication Skills Desire for Improvement Financial Resources/Insurance Housing Resilience Social  Support  ADL's:  Intact  Cognition: Impaired,  Mild  Sleep: Fair   Screenings: GAD-7    Flowsheet Row Clinical Support from 08/26/2021 in [facility] Counselor from 07/24/2021 in [facility]  Total GAD-7 Score 8 14      PHQ2-9    Flowsheet Row Counselor from 10/15/2021 in [facility] Counselor from 07/24/2021 in [facility] Office Visit from 05/17/2020 in [facility] Office Visit from 05/18/2019 in [facility]  PHQ-2 Total Score 2 0 0 1  PHQ-9 Total Score 6 1 2 6       Flowsheet Row ED from 09/19/2021 in [facility] ED from 03/10/2021 in [facility] at [facility] ED from 02/22/2021 in [facility] at [facility] RISK CATEGORY No Risk No Risk No Risk        Assessment and Plan: Aritzel Woehrle is a 21 year old patient with a PPH of ADHD, anxiety, insomnia, intermittent explosive disorder, ODD, intellectual disability and bipolar disorder.  Patient appears to be doing fairly well at current medication dosing regimen.  Patient continues to have residual anxiety however endorses successful use of coping skills and on assessment today displayed very good insight and judgment.  Patient is benefited by living in a supportive environment that also provides stability.    Adjustment disorder with anxiety -Continue Zoloft 50 mg daily - Continue BuSpar 15 mg in the a.m. and 30 mg nightly  Hx bipolar disorder - Continue Seroquel 400 mg nightly - Continue Lamictal 100 mg twice daily  Follow-up with patient approximately 2 months  Collaboration of Care: Collaboration of Care:   Patient/Guardian was advised Release of Information must be obtained prior to any record release in order to collaborate their care with an outside provider. Patient/Guardian was advised if they have not already done so to contact  the registration department to sign all necessary forms in order for 26 to release information regarding their care.   Consent: Patient/Guardian gives verbal consent for treatment and assignment of benefits for services provided during this visit. Patient/Guardian expressed understanding and agreed to proceed.   PGY-3 [place], MD 10/28/2021, 4:35 PM

## 2021-10-30 ENCOUNTER — Encounter: Payer: Self-pay | Admitting: Advanced Practice Midwife

## 2021-10-30 ENCOUNTER — Ambulatory Visit (INDEPENDENT_AMBULATORY_CARE_PROVIDER_SITE_OTHER): Payer: Medicare Other | Admitting: Advanced Practice Midwife

## 2021-10-30 VITALS — BP 110/70 | HR 91 | Ht 59.0 in | Wt 146.0 lb

## 2021-10-30 DIAGNOSIS — N946 Dysmenorrhea, unspecified: Secondary | ICD-10-CM

## 2021-10-30 DIAGNOSIS — N921 Excessive and frequent menstruation with irregular cycle: Secondary | ICD-10-CM

## 2021-10-30 NOTE — Progress Notes (Signed)
21 y.o New GYN presents to establish care.  C/o irregular, heavy, painful periods 10/10, lasting 7+ days. She is currently on OCP to manage her periods.

## 2021-10-30 NOTE — Progress Notes (Signed)
   GYNECOLOGY PROGRESS NOTE  History:  21 y.o. G0P0000 presents to [place] office today for problem gyn visit. She reports painful heavy periods since menarche, improved but not resolved with OCPs. She was on an extended dosing regimen with Camrese but when she reported continuing to have pain with periods, even though they are less frequent, she was switched to a 28 day pill. She has not started the new pill and is not sure that is what she wants to do.  She prefers less frequent periods.  She   She denies h/a, dizziness, shortness of breath, n/v, or fever/chills.    The following portions of the patient's history were reviewed and updated as appropriate: allergies, current medications, past family history, past medical history, past social history, past surgical history and problem list.   Health Maintenance Due  Topic Date Due   HIV Screening  Never done   Hepatitis C Screening  Never done   INFLUENZA VACCINE  08/20/2021     Review of Systems:  Pertinent items are noted in HPI.   Objective:  Physical Exam Blood pressure 110/70, pulse 91, height 4\' 11"  (1.499 m), weight 146 lb (66.2 kg), last menstrual period 07/19/2021. VS reviewed, nursing note reviewed,  Constitutional: well developed, well nourished, no distress HEENT: normocephalic CV: normal rate Pulm/chest wall: normal effort Breast Exam: deferred Abdomen: soft Neuro: alert and oriented x 3 Skin: warm, dry Psych: affect normal Pelvic exam: Cervix pink, visually closed, without lesion, scant white creamy discharge, vaginal walls and external genitalia normal Bimanual exam: Cervix 0/long/high, firm, anterior, neg CMT, uterus nontender, nonenlarged, adnexa without tenderness, enlargement, or mass  Assessment & Plan:  1. Menorrhagia with irregular cycle --Pt with likely endometriosis, based on long hx painful periods. --Will renew Camrese, extended dose OCP, but recommend continuous dosing, for pt to skip the placebo  pills and begin next pack after 84 active pills.  --This continuous dose regimen is medically indicated given pt extreme pain with each menstrual cycle.  Switching to a 28 day pill is not recommended.    - Levonorgestrel-Ethinyl Estradiol (CAMRESE) 0.15-0.03 &0.01 MG tablet; Take 1 tablet by mouth daily.  Dispense: 91 tablet; Refill: 3  2. Dysmenorrhea --This is one or more chronic illnesses with exacerbation, progression, or side effect of treatment.  - Levonorgestrel-Ethinyl Estradiol (CAMRESE) 0.15-0.03 &0.01 MG tablet; Take 1 tablet by mouth daily.  Dispense: 91 tablet; Refill: 3   Return in about 3 months (around 01/30/2022) for After Jan 18 for Pap/follow up with me.   Fatima Blank, CNM 10:17 AM

## 2021-11-03 ENCOUNTER — Other Ambulatory Visit: Payer: Self-pay | Admitting: Advanced Practice Midwife

## 2021-11-03 DIAGNOSIS — N921 Excessive and frequent menstruation with irregular cycle: Secondary | ICD-10-CM

## 2021-11-03 DIAGNOSIS — N946 Dysmenorrhea, unspecified: Secondary | ICD-10-CM

## 2021-11-03 MED ORDER — LEVONORGEST-ETH ESTRAD 91-DAY 0.15-0.03 &0.01 MG PO TABS: 1.0000 | ORAL_TABLET | Freq: Every day | ORAL | 5 refills | Status: DC

## 2021-11-03 MED ORDER — LEVONORGEST-ETH ESTRAD 91-DAY 0.15-0.03 &0.01 MG PO TABS: 1.0000 | ORAL_TABLET | Freq: Every day | ORAL | 3 refills | Status: DC

## 2021-11-06 ENCOUNTER — Other Ambulatory Visit (HOSPITAL_BASED_OUTPATIENT_CLINIC_OR_DEPARTMENT_OTHER): Payer: Self-pay | Admitting: Advanced Practice Midwife

## 2021-11-06 DIAGNOSIS — N921 Excessive and frequent menstruation with irregular cycle: Secondary | ICD-10-CM

## 2021-11-06 MED ORDER — LEVONORGESTREL-ETHINYL ESTRAD 0.1-20 MG-MCG PO TABS: 1.0000 | ORAL_TABLET | Freq: Every day | ORAL | 15 refills | Status: DC

## 2021-11-07 ENCOUNTER — Telehealth: Payer: Self-pay

## 2021-11-07 NOTE — Telephone Encounter (Signed)
-----   Message from Elvera Maria, CNM sent at 11/06/2021  7:36 PM EDT ----- Regarding: RE: Prior auth may be needed Blase Beckner,  Well that is frustrating.  If insurance won't cover an extended regimen pill, she can take a regular 28 day pill continuously. We talked about this during her visit.  I will send her an Rx for a similar medication, but one that has 28 pills.  She should take the 21 pills, skip the placebo ones, and start the next pack. She can do this continuously, or stop and take the 7 placebo pills anytime and have a period.  Can you talk with her about this?  Let me know if there are any more insurance issues.  Thank you!  Lattie Haw  ----- Message ----- From: Lucianne Lei, RN Sent: 11/06/2021   1:26 PM EDT To: Elvera Maria, CNM Subject: RE: Prior Josem Kaufmann may be needed                   Efraim Kaufmann,  I called the patients pharmacy and the pharmacist stated that the patient's insurance completely rejected the rx for Camrese. She states that the insurance did not even give an option to do a PA to get it approved.  Sudeep Scheibel, RN ----- Message ----- From: Elvera Maria, CNM Sent: 11/03/2021  10:21 AM EDT To: Gerrianne Scale [place] Clinical [facility] Subject: Prior auth may be needed                       I wrote Rx for Camrese, an extended dose OCP, on 10/29/21, but her insurance offered alternatives that would not work. I wrote in my  note that I want her on an extended regimen, and I want her to take it continuously (not take placebo pills) due to her symptoms.  She has Medicare due to disability and secondary Medicaid.  If she needs a prior authorization, I can sign it.  Can you check with her pharmacy?  Thank you!

## 2021-11-07 NOTE — Telephone Encounter (Signed)
Left HIPPA compliant vm for patient to return call to the office.

## 2021-12-02 ENCOUNTER — Ambulatory Visit (INDEPENDENT_AMBULATORY_CARE_PROVIDER_SITE_OTHER): Payer: Medicare Other | Admitting: Clinical

## 2021-12-02 DIAGNOSIS — F319 Bipolar disorder, unspecified: Secondary | ICD-10-CM | POA: Diagnosis not present

## 2021-12-10 NOTE — Progress Notes (Signed)
THERAPIST PROGRESS NOTE Virtual Visit via Video Note  I connected with Ariel Hernandez on 12/02/2021 at  4:00 PM EST by a video enabled telemedicine application and verified that I am speaking with the correct person using two identifiers.  Location: Patient: home Provider: office   I discussed the limitations of evaluation and management by telemedicine and the availability of in person appointments. The patient expressed understanding and agreed to proceed.   Follow Up Instructions: I discussed the assessment and treatment plan with the patient. The patient was provided an opportunity to ask questions and all were answered. The patient agreed with the plan and demonstrated an understanding of the instructions.   The patient was advised to call back or seek an in-person evaluation if the symptoms worsen or if the condition fails to improve as anticipated.   Session Time: 35 minutes  Participation Level: Active  Behavioral Response: CasualAlertEuthymic  Type of Therapy: Individual Therapy  Treatment Goals addressed: client will reduce overall depression score by 25% on the PHQ-9  ProgressTowards Goals: Progressing  Interventions: CBT  Summary:  Ariel Hernandez is a 21 y.o. female who presents for the scheduled appointment oriented times five, appropriately dressed, and friendly. Client denied hallucinations and delusions. Client reported on today she is doing well. Client reported she has been working with her sister to apply for jobs. Client reported she is unable to work with [employer] because clients have to be 44 years old and up. Client reported she has been doing applications independently. Client reported she is still working on some life skills such cooking. Client reported she is preparing for spending thanksgiving with her family. Client reported her sister is going to her husband side of the family and she will get sent to her grandparents house and she will  also see her parents. Client reported her sister told her to spend her time at her grandma house and not linger around their parents if they become irritable or if she doesn't feel comfortable. Client reported she wants to have a good relationship with her parents buy by history she does not feel that they want the same.  Evidence of progress towards goal:  client reported reframing negative thoughts at least 4 days per week.  Suicidal/Homicidal: Nowithout intent/plan  Therapist Response:  Therapist began the appointment asking the client how she has been doing since last seen. Therapist used CBT to engage using active listening and positive emotional support. Therapist used CBT to engage and ask the client how she is working through employment opportunities. Therapist used CBT to positively reinforce her problem solving skills with employment. Therapist used CBT to discuss navigating family dynamic and communication. Therapist used CBT ask the client to identify her progress with frequency of use with coping skills with continued practice in her daily activity.    Therapist assigned the client homework to practice self care.    Plan: Return again in 3 weeks.  Diagnosis: bipolar affective disorder, remission status unspecified  Collaboration of Care: Patient refused AEB none requested by the client.  Patient/Guardian was advised Release of Information must be obtained prior to any record release in order to collaborate their care with an outside provider. Patient/Guardian was advised if they have not already done so to contact the registration department to sign all necessary forms in order for [place] to release information regarding their care.   Consent: Patient/Guardian gives verbal consent for treatment and assignment of benefits for services provided during this visit.  Patient/Guardian expressed understanding and agreed to proceed.   Neena Rhymes Dany Walther, LCSW 12/10/2021

## 2021-12-10 NOTE — Plan of Care (Signed)
  Problem: Depression CCP Problem  1  Goal: LTG: Calinda WILL SCORE LESS THAN 10 ON THE PATIENT HEALTH QUESTIONNAIRE (PHQ-9) Outcome: Progressing Goal: STG: Reduce overall depression score by a minimum of 25% on the Patient Health Questionnaire (PHQ-9) or the Montgomery-Asberg Depression Rating Scale (MADRS) Outcome: Progressing

## 2021-12-17 ENCOUNTER — Ambulatory Visit (INDEPENDENT_AMBULATORY_CARE_PROVIDER_SITE_OTHER): Payer: Medicare Other | Admitting: Clinical

## 2021-12-17 DIAGNOSIS — F319 Bipolar disorder, unspecified: Secondary | ICD-10-CM | POA: Diagnosis not present

## 2021-12-23 NOTE — Plan of Care (Signed)
  Problem: Depression CCP Problem  1  Goal: LTG: Takiesha WILL SCORE LESS THAN 10 ON THE PATIENT HEALTH QUESTIONNAIRE (PHQ-9) Outcome: Progressing Goal: STG: Reduce overall depression score by a minimum of 25% on the Patient Health Questionnaire (PHQ-9) or the Montgomery-Asberg Depression Rating Scale (MADRS) Outcome: Progressing   

## 2021-12-23 NOTE — Progress Notes (Signed)
THERAPIST PROGRESS NOTE Virtual Visit via Video Note  I connected with Ariel Hernandez on 12/17/2021 at  3:00 PM EST by a video enabled telemedicine application and verified that I am speaking with the correct person using two identifiers.  Location: Patient: home Provider: office   I discussed the limitations of evaluation and management by telemedicine and the availability of in person appointments. The patient expressed understanding and agreed to proceed.   Follow Up Instructions: I discussed the assessment and treatment plan with the patient. The patient was provided an opportunity to ask questions and all were answered. The patient agreed with the plan and demonstrated an understanding of the instructions.   The patient was advised to call back or seek an in-person evaluation if the symptoms worsen or if the condition fails to improve as anticipated.   Session Time: 30 minutes  Participation Level: Active  Behavioral Response: CasualAlertEuthymic  Type of Therapy: Individual Therapy  Treatment Goals addressed: client will reduce overall depression score by a minimum of 25% on the PHQ9  ProgressTowards Goals: Progressing  Interventions: CBT and Supportive  Summary:  Ariel Hernandez is a 21 y.o. female who presents for the scheduled appointment oriented times five, appropriately dressed, and friendly. Client denied hallucinations and delusions. Client reported she is doing good on today. Client reported thanksgiving went as well as it could have been expected with her parents. Client reported she was not able to sleep at her grandma house because of sleeping issues. Client reported her dad was "okay" for a few days but he eventually reverted back to his usual behavior. Client reported her dad has chronic pain and so he projects it into hateful comments towards her. Client reported he is addiment about telling her even when he is not in pain he still means the things he  days to her. Client reported her feelings get hurt but she cannot walk away from him when he is saying these things. Client reported she cannot walk away because in the past they have called the cops to falsely accuse her of running away when accurately she was trying to cool off for a minute. Client reported her family just tells her "you know how he gets". Client reported she does not like how that is a excuse. Client reported overall she held up well. Client reported otherwise things are well at home with her and her sisters family. Client reported she is continuing to apply for a child. Evidence of progress towards goal:  client reported 1 positive of using reframing thoughts and positive communication in her difficult family dynamic.   Suicidal/Homicidal: Nowithout intent/plan  Therapist Response:  Therapist began the appointment asking the client how she has been doing since last seen. Therapist used CBT to engage using active listening and positive emotional support. Therapist used CBT to ask open ended questions about how she interacted with her family during a stressful anticipated event. Therapist used CBT to reinforce the clients use of positive coping skills such as communication and emotional boundaries. Therapist used CBT ask the client to identify her progress with frequency of use with coping skills with continued practice in her daily activity.    Therapist assigned the client homework to practice self care.   Plan: Return again in 3 weeks.  Diagnosis:   bipolar affective disorder, remission unspecified  Collaboration of Care: Patient refused AEB none requested by the client.  Patient/Guardian was advised Release of Information must be obtained prior to any record  release in order to collaborate their care with an outside provider. Patient/Guardian was advised if they have not already done so to contact the registration department to sign all necessary forms in order for [place] to  release information regarding their care.   Consent: Patient/Guardian gives verbal consent for treatment and assignment of benefits for services provided during this visit. Patient/Guardian expressed understanding and agreed to proceed.   Neena Rhymes Damyen Knoll, LCSW 12/17/2021

## 2021-12-31 ENCOUNTER — Telehealth (INDEPENDENT_AMBULATORY_CARE_PROVIDER_SITE_OTHER): Payer: Medicare Other | Admitting: Student in an Organized Health Care Education/Training Program

## 2021-12-31 DIAGNOSIS — F319 Bipolar disorder, unspecified: Secondary | ICD-10-CM | POA: Diagnosis not present

## 2021-12-31 DIAGNOSIS — F4325 Adjustment disorder with mixed disturbance of emotions and conduct: Secondary | ICD-10-CM | POA: Diagnosis not present

## 2021-12-31 MED ORDER — QUETIAPINE FUMARATE 400 MG PO TABS: 400.0000 mg | ORAL_TABLET | Freq: Every day | ORAL | 3 refills | Status: DC

## 2021-12-31 MED ORDER — BUSPIRONE HCL 15 MG PO TABS: 15.0000 mg | ORAL_TABLET | Freq: Every day | ORAL | 2 refills | Status: DC

## 2021-12-31 MED ORDER — BUSPIRONE HCL 30 MG PO TABS: 30.0000 mg | ORAL_TABLET | Freq: Every day | ORAL | 2 refills | Status: DC

## 2021-12-31 MED ORDER — HYDROXYZINE HCL 25 MG PO TABS: 25.0000 mg | ORAL_TABLET | Freq: Two times a day (BID) | ORAL | 1 refills | Status: DC

## 2021-12-31 MED ORDER — LAMICTAL 100 MG PO TABS: 100.0000 mg | ORAL_TABLET | Freq: Two times a day (BID) | ORAL | 3 refills | Status: DC

## 2021-12-31 NOTE — Patient Instructions (Addendum)
Medication Changes  Stop Zoloft 50mg  daily due to concern for stomach bleed, this medication could worsen it . Do not throw them away, but do not take them right now. If you have another Black bowel movement, please show your sister and if Black and tarry go to the Emergency department or a Urgent Care  3. Start Hydroxyzine 25mg  2x/day if feeling oversedated, can just take 25mg  at night. This should help with anxiety.

## 2021-12-31 NOTE — Progress Notes (Signed)
BH MD/PA/NP OP Progress Note  12/31/2021 1:22 PM Ariel Hernandez  MRN:  [id number]  Chief Complaint:  Chief Complaint  Patient presents with   Follow-up   Virtual Visit via Video Note  I connected with Ariel Hernandez on 12/31/21 at  1:00 PM EST by a video enabled telemedicine application and verified that I am speaking with the correct person using two identifiers.  Location: Patient: Home Provider: Office   I discussed the limitations of evaluation and management by telemedicine and the availability of in person appointments. The patient expressed understanding and agreed to proceed.  History of Present Illness: Ariel Hernandez is a 21 year old patient with a PPH of ADHD, anxiety, insomnia, intermittent explosive disorder, ODD, intellectual disability and bipolar disorder.  Patient reports she is compliant with the following medication regimen:   Seroquel 400 mg nightly Lamictal 100 mg twice daily Zoloft 50 mg daily BuSpar 15 mg in the a.m. and 30 mg nightly  Patient reports she has been doing "ok." Patient reports that she saw her parents over the holidays, and it was difficult at times, but she used her coping skills and was able to get through the situation. Patient reports that she is spending her day helping around home. Patient reports that she is exercising. Patient reports that everything at home is going well. Patient reports that her sister is a bit concerned about patient's appetite. Patient reports that her sister is worried that it is low. Patient reports that she has been snacking more lately when she takes her meds, but not really eating her meals. Patient reports that this has been going on the last 2 weeks. Patient denies significant weight loss, despite trying to lose weight by exercising. Patient denies she is actively restricting her diet to lose weight. Patient reports she is sleeping "pretty decent." Patient reports that she will have some nights where she  wakes up and it takes time to fall asleep, at 3 days in a week.   Patient reports that she is working limiting her anxious thoughts. Patient reports that she has been having some nightmares and strange dreams, often things that she worries that something will happen in the future. Patient reports that she is always worried about her dad, because "he is always hurt."   Patient reports her mood is a "7.5/10" with 10 being great. Patient reports that if her dad were healthier that would likely get her closer  to a 10, but she also endorses that this is highly unrealistically.   Patient reports that her stomach and chest (in the middle, sharp pains) have been hurting more lately. Patient reports she has also been having more migraines lately. Patient endorses that the winter can also aggravate her asthma and she has been having some wheezing episodes. Patient reports she has also been having more bloating lately. Patient reports that she has noticed that certain foods can make her stomach pain worse, and it does feel like a stabbing, stinging, punching pain. Patient reports she has had occasional black stools, with the last 1 being 5 days stomach.   Patient denies SI, HI, AVH.   I discussed the assessment and treatment plan with the patient. The patient was provided an opportunity to ask questions and all were answered. The patient agreed with the plan and demonstrated an understanding of the instructions.   The patient was advised to call back or seek an in-person evaluation if the symptoms worsen or if the condition fails to improve as  anticipated.  I provided 25 minutes of non-face-to-face time during this encounter.   Bobbye Morton, MD  Visit Diagnosis:    ICD-10-CM   1. Adjustment disorder with mixed disturbance of emotions and conduct  F43.25     2. Bipolar affective disorder, remission status unspecified (HCC)  F31.9       Past Psychiatric History:    Intellectual disability,  intermittent explosive disorder, conduct disorder, oppositional defiant disorder, generalized anxiety disorder, adjustment disorder, bipolar disorder unspecified Patient has had prior outpatient psychiatric care with Ariel Hernandez in [facility] for most of her childhood, was also seen at [facility] at some point - Prior medications include clonidine 0.3 mg (for nightmares) along with current medication regimen, Seroquel 200 mg daily and 400 mg nightly, Lamictal 100 twice daily, Zoloft 100 mg (recently decreased to 50 mg), BuSpar 15 mg 3 times daily. - Patient was in [facility] in 2019-2020   08/2021: Patient Seroquel titrated up from 200 mg to 300 mg and then 400 mg nightly.  All other medications continued at previous dosage.  Patient was having anxiety about being her own guardian and difficulty sleeping, as well as numerous thoughts in her mind that were concerning to her.  10/2021: Patient doing well medications continued as they were.  Past Medical History:  Past Medical History:  Diagnosis Date   ADHD (attention deficit hyperactivity disorder)    Allergy    Seasonal and animals   Anxiety    Asthma    Depression    [employer]reesia 05/17/2020   Oppositional defiant behavior    Pneumonia    PTSD (post-traumatic stress disorder)    Urinary tract infection    hx;of only once   Vision abnormalities    Hx: wears glasses    Past Surgical History:  Procedure Laterality Date   ADENOIDECTOMY     TOOTH EXTRACTION N/A 10/11/2012   Procedure: EXTRACTIONS  TEETH 5, 12, 21, 28 ;  Surgeon: [name], DDS;  Location: [facility] OR;  Service: Oral Surgery;  Laterality: N/A;    Family Psychiatric History: Biological mother: Bipolar disorder and substance use disorder   Family History:  Family History  Adopted: Yes    Social History:  Social History   Socioeconomic History   Marital status: Single    Spouse name: Not on file   Number of children: 0   Years of education: Not on file   Highest  education level: Not on file  Occupational History   Not on file  Tobacco Use   Smoking status: Never   Smokeless tobacco: Never  Vaping Use   Vaping Use: Never used  Substance and Sexual Activity   Alcohol use: No   Drug use: No   Sexual activity: Never    Birth control/protection: Pill    Comment: Heterosexual  Other Topics Concern   Not on file  Social History Narrative   Not on file   Social Determinants of Health   Financial Resource Strain: Not on file  Food Insecurity: Not on file  Transportation Needs: Not on file  Physical Activity: Not on file  Stress: Not on file  Social Connections: Not on file    Allergies:  Allergies  Allergen Reactions   Fish Allergy    Focalin [Dexmethylphenidate]    Hydrocortisone Rash   Other Diarrhea and Nausea And Vomiting    Fish, milk    Metabolic Disorder Labs: No results found for: "HGBA1C", "MPG" Lab Results  Component Value Date  PROLACTIN 5.5 10/02/2019   Lab Results  Component Value Date   CHOL 213 (H) 10/02/2019   TRIG 187 (H) 10/02/2019   HDL 46 10/02/2019   CHOLHDL 4.6 10/02/2019   VLDL 37 10/02/2019   LDLCALC 130 (H) 10/02/2019   Lab Results  Component Value Date   TSH 2.295 10/02/2019    Therapeutic Level Labs: No results found for: "LITHIUM" No results found for: "VALPROATE" No results found for: "CBMZ"  Current Medications: Current Outpatient Medications  Medication Sig Dispense Refill   albuterol (PROAIR HFA) 108 (90 Base) MCG/ACT inhaler Inhale 2 puffs into the lungs every 4 (four) hours as needed (cough). 36 g 5   albuterol (PROVENTIL) (2.5 MG/3ML) 0.083% nebulizer solution Take 3 mLs (2.5 mg total) by nebulization every 4 (four) hours as needed for wheezing or shortness of breath. 75 mL 2   busPIRone (BUSPAR) 15 MG tablet Take 1 tablet (15 mg total) by mouth daily. 30 tablet 2   busPIRone (BUSPAR) 30 MG tablet Take 1 tablet (30 mg total) by mouth at bedtime. 30 tablet 2   cetirizine (ZYRTEC  ALLERGY) 10 MG tablet Take 1 tablet (10 mg total) by mouth daily. (Patient not taking: Reported on 10/30/2021) 30 tablet 0   fluticasone (FLONASE) 50 MCG/ACT nasal spray Place 1 spray into both nostrils daily. 16 mL 11   fluticasone (FLOVENT HFA) 110 MCG/ACT inhaler Inhale 2 puffs into the lungs 2 (two) times daily. USE WITH SPACER. 12 g 5   LAMICTAL 100 MG tablet Take 1 tablet (100 mg total) by mouth 2 (two) times daily. 60 tablet 2   levonorgestrel-ethinyl estradiol (ALESSE) 0.1-20 MG-MCG tablet Take 1 tablet by mouth daily. Take 21 active pills then discard pack and start next pack. Do not take placebo pills. 28 tablet 15   loratadine (CLARITIN) 10 MG tablet Take 1 tablet (10 mg total) by mouth daily. 30 tablet 11   MIRALAX 17 GM/SCOOP powder Take 17 g by mouth daily.     montelukast (SINGULAIR) 10 MG tablet Take 1 tablet (10 mg total) by mouth at bedtime. 30 tablet 11   pseudoephedrine (SUDAFED) 60 MG tablet Take 1 tablet (60 mg total) by mouth every 8 (eight) hours as needed for congestion. 30 tablet 0   QUEtiapine (SEROQUEL) 400 MG tablet Take 1 tablet (400 mg total) by mouth at bedtime. 30 tablet 3   sertraline (ZOLOFT) 50 MG tablet Take 1 tablet (50 mg total) by mouth daily. 30 tablet 2   triamcinolone cream (KENALOG) 0.1 % APPLY TO AFFECTED AREA TWICE A DAY 453.6 g 0   No current facility-administered medications for this visit.     Musculoskeletal: Defer  Psychiatric Specialty Exam: Review of Systems  Gastrointestinal:  Positive for abdominal pain.    There were no vitals taken for this visit.There is no height or weight on file to calculate BMI.  General Appearance: Casual  Eye Contact:  Fair  Speech:  Clear and Coherent  Volume:  Normal  Mood:  Euthymic  Affect:  Appropriate  Thought Process:  Coherent  Orientation:  Full (Time, Place, and Person)  Thought Content: Logical   Suicidal Thoughts:  No  Homicidal Thoughts:  No  Memory:  Immediate;   Good Recent;   Good   Judgement:  Fair  Insight:  Shallow  Psychomotor Activity:  Normal  Concentration:  Concentration: Fair  Recall:  NA  Fund of Knowledge: Fair  Language: Good  Akathisia:  Negative  Handed:  AIMS (if indicated): not done  Assets:  Communication Skills Desire for Improvement Housing Leisure Time Resilience Social Support  ADL's:  Intact  Cognition: Baseline  Sleep:  Fair   Screenings: GAD-7    Flowsheet Row Clinical Support from 08/26/2021 in [facility] Counselor from 07/24/2021 in [facility]  Total GAD-7 Score 8 14      PHQ2-9    Flowsheet Row Counselor from 10/15/2021 in [facility] Counselor from 07/24/2021 in [facility] Office Visit from 05/17/2020 in [facility] Office Visit from 05/18/2019 in [facility]  PHQ-2 Total Score 2 0 0 1  PHQ-9 Total Score 6 1 2 6       Flowsheet Row ED from 09/19/2021 in [facility] ED from 03/10/2021 in [facility] ED from 02/22/2021 in [facility] RISK CATEGORY No Risk No Risk No Risk        Assessment and Plan:  Ariel Hernandez is a 21 year old patient with a PPH of ADHD, anxiety, insomnia, intermittent explosive disorder, ODD, intellectual disability and bipolar disorder.  Patient's sudden endorsing worries about her dad and nightmares and decreased sleep, may be related to recently seeing her dad at Thanksgiving. This did seem to be a bit of a stressors for patient which she tried to cope with acutely. Patient had insight to recognizes that in there past when she has nightmares it was after a fairly emotional day. Due to patient decrease in appetite, stomach and chest pains as well as possible black stools, will stop Zoloft out of caution for stomach bleed. Will prescribed Hydroxyzine for anxiety in place of  Zoloft, may also help if patient has gastritis or stomach bleeding.  Recommended that if next stool, or when neck stool is black-patient she will her sister and if confirmed patient is to report to an ED or urgent care.  Patient endorsed understanding.  Adjustment disorder with anxiety - Discontinue Zoloft 50 mg daily - Continue BuSpar 15 mg in the a.m. and 30 mg nightly - Start Hydroxyzine 25mg  BID  Hx bipolar disorder - Continue Seroquel 400 mg nightly - Continue Lamictal 100 mg twice daily  Collaboration of Care: Collaboration of Care:   Patient/Guardian was advised Release of Information must be obtained prior to any record release in order to collaborate their care with an outside provider. Patient/Guardian was advised if they have not already done so to contact the registration department to sign all necessary forms in order for 12-28-1985 to release information regarding their care.   Consent: Patient/Guardian gives verbal consent for treatment and assignment of benefits for services provided during this visit. Patient/Guardian expressed understanding and agreed to proceed.   PGY-3 , MD 12/31/2021, 1:22 PM

## 2022-01-02 ENCOUNTER — Ambulatory Visit (INDEPENDENT_AMBULATORY_CARE_PROVIDER_SITE_OTHER): Payer: Medicare Other | Admitting: Clinical

## 2022-01-02 DIAGNOSIS — F319 Bipolar disorder, unspecified: Secondary | ICD-10-CM | POA: Diagnosis not present

## 2022-01-02 NOTE — Progress Notes (Signed)
THERAPIST PROGRESS NOTE Virtual Visit via Video Note  I connected with Ariel Hernandez on 01/02/2022 at  2:00 PM EST by a video enabled telemedicine application and verified that I am speaking with the correct person using two identifiers.  Location: Patient: home Provider: office   I discussed the limitations of evaluation and management by telemedicine and the availability of in person appointments. The patient expressed understanding and agreed to proceed.   Follow Up Instructions: I discussed the assessment and treatment plan with the patient. The patient was provided an opportunity to ask questions and all were answered. The patient agreed with the plan and demonstrated an understanding of the instructions.   The patient was advised to call back or seek an in-person evaluation if the symptoms worsen or if the condition fails to improve as anticipated.   Session Time: 45 minutes  Participation Level: Active  Behavioral Response: CasualAlertEuthymic  Type of Therapy: Individual Therapy  Treatment Goals addressed: client will reduce overall depression score by a minimum of 25% on the PHQ9  ProgressTowards Goals: Progressing  Interventions: CBT and Supportive  Summary:  Ariel Hernandez is a 21 y.o. female who presents the scheduled appointment oriented times five, appropriately dressed, and friendly. Client denied hallucinations and delusions. Client reported she is doing fairly okay. Client reported she had her medication changed by the psychiatrist due to her complaint of some stomach issues. Client reported she had a visit from her post release officer and her meetings have been going well for that. Client reported she is still on track to complete March 2024. Client reported she hasn't had issues with depression. Client reported some anxiety but worrying is usually a baseline for her. Client reported she tries to help her sister and brother in law out by keeping the  house clean while she is gone and helping with their children. Client reported lately she has felt like maybe she is not doing enough to help her sister out as they had a small argument about one another not feeling good enough. Client reported for christmas eve she will have to go to her parents house which she doesn't look forward to. Client reported her parents especially her father are rude and dismissive towards her. Client reported one of the things her sister has talked to her about is eventually moving to her own apartment. Client reported she does like aspect of eventually being able to have space between her and them. Client reported she loves her parents but they have been the reason why she has been to jail/ prison. Client reported in the past her parents have fabricated so many stories that have had her sent away. Client reported she tried to speak up for herself but the police never believed her. Client reported she will try her best to have a good visit with them. Evidence of progress towards goal:  client reported reframing negative thoughts 7 out of 7 days per week.   Suicidal/Homicidal: Nowithout intent/plan  Therapist Response:  Therapist began the appointment asking the client how she has been doing. Therapist used CBT to engage using active listening and positive emotional support. Therapist used CBT to engage and ask the client how she is responding to medication compliance. Therapist used CBT to engage ask the client about sources of depression as it relates to family and other stressors. Therapist used CBT to use positive reinforcement of positive reframing and finding the positives in her life. Therapist used CBT ask the client to  identify her progress with frequency of use with coping skills with continued practice in her daily activity.    Therapist assigned the client homework to practice self care.   Plan: Return again in 3 weeks.  Diagnosis: bipolar affective disorder,  remission status unspecified  Collaboration of Care: Patient refused AEB none requested by the client.  Patient/Guardian was advised Release of Information must be obtained prior to any record release in order to collaborate their care with an outside provider. Patient/Guardian was advised if they have not already done so to contact the registration department to sign all necessary forms in order for [place] to release information regarding their care.   Consent: Patient/Guardian gives verbal consent for treatment and assignment of benefits for services provided during this visit. Patient/Guardian expressed understanding and agreed to proceed.   Neena Rhymes Adonia Porada, LCSW 01/02/2022

## 2022-02-18 ENCOUNTER — Ambulatory Visit (HOSPITAL_COMMUNITY)
Admission: EM | Admit: 2022-02-18 | Discharge: 2022-02-18 | Disposition: A | Discharge status: Home / Self Care | Payer: Medicare Other

## 2022-02-18 ENCOUNTER — Ambulatory Visit (INDEPENDENT_AMBULATORY_CARE_PROVIDER_SITE_OTHER): Payer: Medicare Other

## 2022-02-18 ENCOUNTER — Encounter (HOSPITAL_COMMUNITY): Payer: Self-pay

## 2022-02-18 DIAGNOSIS — K5900 Constipation, unspecified: Secondary | ICD-10-CM

## 2022-02-18 DIAGNOSIS — J309 Allergic rhinitis, unspecified: Secondary | ICD-10-CM | POA: Insufficient documentation

## 2022-02-18 DIAGNOSIS — F988 Other specified behavioral and emotional disorders with onset usually occurring in childhood and adolescence: Secondary | ICD-10-CM | POA: Insufficient documentation

## 2022-02-18 DIAGNOSIS — L209 Atopic dermatitis, unspecified: Secondary | ICD-10-CM | POA: Insufficient documentation

## 2022-02-18 DIAGNOSIS — F605 Obsessive-compulsive personality disorder: Secondary | ICD-10-CM | POA: Insufficient documentation

## 2022-02-18 DIAGNOSIS — F429 Obsessive-compulsive disorder, unspecified: Secondary | ICD-10-CM | POA: Insufficient documentation

## 2022-02-18 DIAGNOSIS — R21 Rash and other nonspecific skin eruption: Secondary | ICD-10-CM

## 2022-02-18 MED ORDER — DOXYCYCLINE HYCLATE 100 MG PO CAPS: 100.0000 mg | ORAL_CAPSULE | Freq: Two times a day (BID) | ORAL | 0 refills | Status: AC

## 2022-02-18 NOTE — ED Provider Notes (Signed)
[facility]    CSN: [id number] Arrival date & time: 02/18/22  1827      History   Chief Complaint Chief Complaint  Patient presents with   Rash    HPI Ariel Hernandez is a 22 y.o. female.   Patient complains of rash around her mouth and nose that started several weeks ago.  She was seen by her PCP and diagnosed with impetigo.  Advised to apply mupirocin 3 times a day.  She reports no improvement with this.  He reports nausea is burning.  Has congestion, rhinorrhea, fever or chills. She also discussed chronic constipation with PCP at last visit.  At that visit PCP had ordered imaging of her abdomen.  Her caregiver reports she was unable to take her due to work schedule.  They are requesting imaging be done in clinic today.    Past Medical History:  Diagnosis Date   ADHD (attention deficit hyperactivity disorder)    Allergy    Seasonal and animals   Anxiety    Asthma    Depression    Phreesia 05/17/2020   Oppositional defiant behavior    Pneumonia    PTSD (post-traumatic stress disorder)    Urinary tract infection    hx;of only once   Vision abnormalities    Hx: wears glasses    Patient Active Problem List   Diagnosis Date Noted   AR (allergic rhinitis) 02/18/2022   ADD (attention deficit disorder) 02/18/2022   OCD (obsessive compulsive disorder) 02/18/2022   Atopic dermatitis 02/18/2022   Bipolar disorder ([place]) 12/31/2021   Dysmenorrhea 11/03/2021   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[place]) 08/08/2021   PTSD (post-traumatic stress disorder) 08/08/2021   Intellectual disability 04/27/2020   Intermittent explosive disorder 04/27/2020   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   Menorrhagia with irregular cycle 02/09/2019   Mild persistent asthma without complication 69/48/5462   Seasonal allergic rhinitis due to pollen 02/09/2019   Intrinsic (allergic) eczema 02/09/2019   Generalized anxiety  disorder 02/09/2019   Oppositional defiant disorder 02/09/2019   Other constipation 07/22/2017   ADHD (attention deficit hyperactivity disorder) 06/30/2011    Past Surgical History:  Procedure Laterality Date   ADENOIDECTOMY     TOOTH EXTRACTION N/A 10/11/2012   Procedure: EXTRACTIONS  TEETH 5, 12, 21, 28 ;  Surgeon: Gae Bon, DDS;  Location: [facility] OR;  Service: Oral Surgery;  Laterality: N/A;    OB History     Gravida  0   Para  0   Term  0   Preterm  0   AB  0   Living  0      SAB  0   IAB  0   Ectopic  0   Multiple  0   Live Births  0            Home Medications    Prior to Admission medications   Medication Sig Start Date End Date Taking? Authorizing Provider  cloNIDine (CATAPRES) 0.3 MG tablet one po qhs 02/27/21  Yes [provider]  doxycycline (VIBRAMYCIN) 100 MG capsule Take 1 capsule (100 mg total) by mouth 2 (two) times daily for 7 days. 02/18/22 02/25/22 Yes Ward, Lenise Arena, PA-C  albuterol (PROAIR HFA) 108 (90 Base) MCG/ACT inhaler Inhale 2 puffs into the lungs every 4 (four) hours as needed (cough). 05/17/20   Mannie Stabile, MD  albuterol (PROVENTIL) (2.5 MG/3ML) 0.083% nebulizer solution Take  3 mLs (2.5 mg total) by nebulization every 4 (four) hours as needed for wheezing or shortness of breath. 06/04/20   Vella Kohler, MD  busPIRone (BUSPAR) 15 MG tablet Take 1 tablet (15 mg total) by mouth daily. 12/31/21 12/31/22  Bobbye Morton, MD  busPIRone (BUSPAR) 30 MG tablet Take 1 tablet (30 mg total) by mouth at bedtime. 12/31/21 12/31/22  Bobbye Morton, MD  cetirizine (ZYRTEC ALLERGY) 10 MG tablet Take 1 tablet (10 mg total) by mouth daily. Patient not taking: Reported on 10/30/2021 12/06/20   Wallis Bamberg, PA-C  fluticasone [facility]) 50 MCG/ACT nasal spray Place 1 spray into both nostrils daily. 05/17/20   Vella Kohler, MD  fluticasone (FLOVENT HFA) 110 MCG/ACT inhaler Inhale 2 puffs into the lungs 2 (two) times daily. USE WITH  SPACER. 05/17/20   Vella Kohler, MD  hydrOXYzine (ATARAX) 25 MG tablet Take 1 tablet (25 mg total) by mouth 2 (two) times daily. 12/31/21   Bobbye Morton, MD  LAMICTAL 100 MG tablet Take 1 tablet (100 mg total) by mouth 2 (two) times daily. 12/31/21   Bobbye Morton, MD  levonorgestrel-ethinyl estradiol (ALESSE) 0.1-20 MG-MCG tablet Take 1 tablet by mouth daily. Take 21 active pills then discard pack and start next pack. Do not take placebo pills. 11/06/21   Leftwich-Kirby, Wilmer Floor, CNM  loratadine (CLARITIN) 10 MG tablet Take 1 tablet (10 mg total) by mouth daily. 05/17/20   Vella Kohler, MD  MIRALAX 17 GM/SCOOP powder Take 17 g by mouth daily. 02/01/19   [provider]  montelukast (SINGULAIR) 10 MG tablet Take 1 tablet (10 mg total) by mouth at bedtime. 05/17/20   Vella Kohler, MD  pseudoephedrine (SUDAFED) 60 MG tablet Take 1 tablet (60 mg total) by mouth every 8 (eight) hours as needed for congestion. 12/06/20   Wallis Bamberg, PA-C  QUEtiapine (SEROQUEL) 400 MG tablet Take 1 tablet (400 mg total) by mouth at bedtime. 12/31/21   Bobbye Morton, MD  sertraline (ZOLOFT) 50 MG tablet Take 1 tablet (50 mg total) by mouth daily. 10/28/21 10/28/22  Bobbye Morton, MD  triamcinolone cream (KENALOG) 0.1 % APPLY TO AFFECTED AREA TWICE A DAY 03/10/21   Theadora Rama Scales, PA-C    Family History Family History  Adopted: Yes    Social History Social History   Tobacco Use   Smoking status: Never   Smokeless tobacco: Never  Vaping Use   Vaping Use: Never used  Substance Use Topics   Alcohol use: No   Drug use: No     Allergies   Fish allergy, Focalin [dexmethylphenidate], Hydrocortisone, and Other   Review of Systems Review of Systems  Constitutional:  Negative for chills and fever.  HENT:  Negative for ear pain and sore throat.   Eyes:  Negative for pain and visual disturbance.  Respiratory:  Negative for cough and shortness of breath.   Cardiovascular:  Negative  for chest pain and palpitations.  Gastrointestinal:  Negative for abdominal pain and vomiting.  Genitourinary:  Negative for dysuria and hematuria.  Musculoskeletal:  Negative for arthralgias and back pain.  Skin:  Positive for rash. Negative for color change.  Neurological:  Negative for seizures and syncope.  All other systems reviewed and are negative.    Physical Exam Triage Vital Signs ED Triage Vitals  Enc Vitals Group     BP 02/18/22 1838 (!) 140/78     Pulse Rate 02/18/22 1838 93  Resp 02/18/22 1838 16     Temp 02/18/22 1838 98.3 F (36.8 C)     Temp Source 02/18/22 1838 Oral     SpO2 02/18/22 1838 100 %     Weight --      Height --      Head Circumference --      Peak Flow --      Pain Score 02/18/22 1840 0     Pain Loc --      Pain Edu? --      Excl. in [place]? --    No data found.  Updated Vital Signs BP (!) 140/78 (BP Location: Left Arm)   Pulse 93   Temp 98.3 F (36.8 C) (Oral)   Resp 16   SpO2 100%   Visual Acuity Right Eye Distance:   Left Eye Distance:   Bilateral Distance:    Right Eye Near:   Left Eye Near:    Bilateral Near:     Physical Exam Vitals and nursing note reviewed.  Constitutional:      General: She is not in acute distress.    Appearance: She is well-developed.  HENT:     Head: Normocephalic and atraumatic.  Eyes:     Conjunctiva/sclera: Conjunctivae normal.  Cardiovascular:     Rate and Rhythm: Normal rate and regular rhythm.     Heart sounds: No murmur heard. Pulmonary:     Effort: Pulmonary effort is normal. No respiratory distress.     Breath sounds: Normal breath sounds.  Abdominal:     Palpations: Abdomen is soft.     Tenderness: There is no abdominal tenderness.  Musculoskeletal:        General: No swelling.     Cervical back: Neck supple.  Skin:    General: Skin is warm and dry.     Capillary Refill: Capillary refill takes less than 2 seconds.     Comments: Rash to the base of the nose bilaterally and  around the mouth no yellow discharge noted.  Neurological:     Mental Status: She is alert.  Psychiatric:        Mood and Affect: Mood normal.      UC Treatments / Results  Labs (all labs ordered are listed, but only abnormal results are displayed) Labs Reviewed - No data to display  EKG   Radiology No results found.  Procedures Procedures (including critical care time)  Medications Ordered in UC Medications - No data to display  Initial Impression / Assessment and Plan / UC Course  I have reviewed the triage vital signs and the nursing notes.  Pertinent labs & imaging results that were available during my care of the patient were reviewed by me and considered in my medical decision making (see chart for details).     Rash consistent with perioral dermatitis versus impetigo.  Doxycycline prescribed.  Advise follow-up with PCP. Chronic constipation, imaging done in clinic today at pt request.  Pt was unable to go for imaging PCP had ordered.  Advised to follow-up with PCP as discussed with her PCP at last visit. Final Clinical Impressions(s) / UC Diagnoses   Final diagnoses:  Rash  Constipation, unspecified constipation type     Discharge Instructions      Antibiotic as prescribed. Apply Aquaphor.  Follow-up with PCP regarding imaging.    ED Prescriptions     Medication Sig Dispense Auth. Provider   doxycycline (VIBRAMYCIN) 100 MG capsule Take 1 capsule (100 mg total) by  mouth 2 (two) times daily for 7 days. 14 capsule Ward, Lenise Arena, PA-C      PDMP not reviewed this encounter.   Ward, Lenise Arena, PA-C 02/18/22 1940

## 2022-02-18 NOTE — ED Triage Notes (Signed)
Pt presents to office for rash on her nose and back  since January.

## 2022-02-18 NOTE — Discharge Instructions (Addendum)
Take antibiotic as prescribed. Apply Aquaphor.  Moderate stool noted on imaging. Forwarded to PCP.  Follow-up with PCP regarding imaging.

## 2022-03-03 ENCOUNTER — Ambulatory Visit (HOSPITAL_COMMUNITY)
Admission: RE | Admit: 2022-03-03 | Discharge: 2022-03-03 | Disposition: A | Discharge status: Home / Self Care | Payer: Medicare Other | Source: Ambulatory Visit | Attending: Internal Medicine | Admitting: Internal Medicine

## 2022-03-03 ENCOUNTER — Encounter (HOSPITAL_COMMUNITY): Payer: Self-pay

## 2022-03-03 VITALS — BP 122/80 | HR 92 | Temp 98.0°F | Resp 16

## 2022-03-03 DIAGNOSIS — F319 Bipolar disorder, unspecified: Secondary | ICD-10-CM

## 2022-03-03 DIAGNOSIS — R21 Rash and other nonspecific skin eruption: Secondary | ICD-10-CM | POA: Diagnosis not present

## 2022-03-03 DIAGNOSIS — F419 Anxiety disorder, unspecified: Secondary | ICD-10-CM

## 2022-03-03 MED ORDER — HYDROXYZINE HCL 25 MG PO TABS: 25.0000 mg | ORAL_TABLET | Freq: Two times a day (BID) | ORAL | 0 refills | Status: DC

## 2022-03-03 MED ORDER — CLOTRIMAZOLE-BETAMETHASONE 1-0.05 % EX CREA: TOPICAL_CREAM | CUTANEOUS | 0 refills | Status: DC

## 2022-03-03 NOTE — ED Provider Notes (Signed)
[facility]    CSN: [id number] Arrival date & time: 03/03/22  1805      History   Chief Complaint Chief Complaint  Patient presents with   Rash    Entered by patient    HPI Ariel Hernandez is a 22 y.o. female who presens with unresolved rash since 12/'23. [place], steroid, neosporin, aquafore, vaceline, basotracin, and Doxy has not helped. Rash is not worse, but is not getting better and has not spread. Has not tried any antifungals. Per her sister, the rash looked darker red than what it is now. Pt admits that is itches some. They did not see PCP as instructed the last visit, since they could not see her til 2/20.   2- She is also out of her Hydroxyzine and has left a message for her psychiatrist, but has not heard from them. Has apt 2/15 with her. Would like a few til then.   Past Medical History:  Diagnosis Date   ADHD (attention deficit hyperactivity disorder)    Allergy    Seasonal and animals   Anxiety    Asthma    Depression    Phreesia 05/17/2020   Oppositional defiant behavior    Pneumonia    PTSD (post-traumatic stress disorder)    Urinary tract infection    hx;of only once   Vision abnormalities    Hx: wears glasses    Patient Active Problem List   Diagnosis Date Noted   AR (allergic rhinitis) 02/18/2022   ADD (attention deficit disorder) 02/18/2022   OCD (obsessive compulsive disorder) 02/18/2022   Atopic dermatitis 02/18/2022   Bipolar disorder ([place]) 12/31/2021   Dysmenorrhea 11/03/2021   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[place]) 08/08/2021   PTSD (post-traumatic stress disorder) 08/08/2021   Intellectual disability 04/27/2020   Intermittent explosive disorder 04/27/2020   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   Menorrhagia with irregular cycle 02/09/2019   Mild persistent asthma without complication [id number]   Seasonal allergic rhinitis due to pollen 02/09/2019   Intrinsic  (allergic) eczema 02/09/2019   Generalized anxiety disorder 02/09/2019   Oppositional defiant disorder 02/09/2019   Other constipation 07/22/2017   ADHD (attention deficit hyperactivity disorder) 06/30/2011    Past Surgical History:  Procedure Laterality Date   ADENOIDECTOMY     TOOTH EXTRACTION N/A 10/11/2012   Procedure: EXTRACTIONS  TEETH 5, 12, 21, 28 ;  Surgeon: Gae Bon, DDS;  Location: [facility] OR;  Service: Oral Surgery;  Laterality: N/A;    OB History     Gravida  0   Para  0   Term  0   Preterm  0   AB  0   Living  0      SAB  0   IAB  0   Ectopic  0   Multiple  0   Live Births  0            Home Medications    Prior to Admission medications   Medication Sig Start Date End Date Taking? Authorizing Provider  clotrimazole-betamethasone (LOTRISONE) cream Apply to affected area 2 times daily  for 7 days 03/03/22  Yes Rodriguez-Southworth, Sunday Spillers, PA-C  albuterol (PROAIR HFA) 108 (90 Base) MCG/ACT inhaler Inhale 2 puffs into the lungs every 4 (four) hours as needed (cough). 05/17/20   Mannie Stabile, MD  albuterol (PROVENTIL) (2.5 MG/3ML) 0.083% nebulizer solution Take 3 mLs (2.5 mg total) by nebulization every 4 (four)  hours as needed for wheezing or shortness of breath. 06/04/20   Mannie Stabile, MD  busPIRone (BUSPAR) 15 MG tablet Take 1 tablet (15 mg total) by mouth daily. 12/31/21 12/31/22  Freida Busman, MD  busPIRone (BUSPAR) 30 MG tablet Take 1 tablet (30 mg total) by mouth at bedtime. 12/31/21 12/31/22  Freida Busman, MD  cloNIDine (CATAPRES) 0.3 MG tablet one po qhs 02/27/21   [provider]  fluticasone (FLONASE) 50 MCG/ACT nasal spray Place 1 spray into both nostrils daily. 05/17/20   Mannie Stabile, MD  fluticasone (FLOVENT HFA) 110 MCG/ACT inhaler Inhale 2 puffs into the lungs 2 (two) times daily. USE WITH SPACER. 05/17/20   Mannie Stabile, MD  hydrOXYzine (ATARAX) 25 MG tablet Take 1 tablet (25 mg total) by mouth 2 (two)  times daily. 03/03/22   Rodriguez-Southworth, Sunday Spillers, PA-C  LAMICTAL 100 MG tablet Take 1 tablet (100 mg total) by mouth 2 (two) times daily. 12/31/21   Freida Busman, MD  levonorgestrel-ethinyl estradiol (ALESSE) 0.1-20 MG-MCG tablet Take 1 tablet by mouth daily. Take 21 active pills then discard pack and start next pack. Do not take placebo pills. 11/06/21   Leftwich-Kirby, Kathie Dike, CNM  loratadine (CLARITIN) 10 MG tablet Take 1 tablet (10 mg total) by mouth daily. 05/17/20   Mannie Stabile, MD  MIRALAX 17 GM/SCOOP powder Take 17 g by mouth daily. 02/01/19   [provider]  montelukast (SINGULAIR) 10 MG tablet Take 1 tablet (10 mg total) by mouth at bedtime. 05/17/20   Mannie Stabile, MD  pseudoephedrine (SUDAFED) 60 MG tablet Take 1 tablet (60 mg total) by mouth every 8 (eight) hours as needed for congestion. 12/06/20   Jaynee Eagles, PA-C  QUEtiapine (SEROQUEL) 400 MG tablet Take 1 tablet (400 mg total) by mouth at bedtime. 12/31/21   Freida Busman, MD  sertraline (ZOLOFT) 50 MG tablet Take 1 tablet (50 mg total) by mouth daily. 10/28/21 10/28/22  Freida Busman, MD  triamcinolone cream (KENALOG) 0.1 % APPLY TO AFFECTED AREA TWICE A DAY 03/10/21   Lynden Oxford Scales, PA-C    Family History Family History  Adopted: Yes    Social History Social History   Tobacco Use   Smoking status: Never   Smokeless tobacco: Never  Vaping Use   Vaping Use: Never used  Substance Use Topics   Alcohol use: No   Drug use: No     Allergies   Fish allergy, Focalin [dexmethylphenidate], Hydrocortisone, and Other   Review of Systems Review of Systems  Constitutional:  Negative for fever.  HENT:  Negative for rhinorrhea.   Skin:  Positive for color change and rash. Negative for pallor and wound.     Physical Exam Triage Vital Signs ED Triage Vitals  Enc Vitals Group     BP 03/03/22 1827 122/80     Pulse Rate 03/03/22 1827 92     Resp 03/03/22 1827 16     Temp 03/03/22 1827 98  F (36.7 C)     Temp Source 03/03/22 1827 Oral     SpO2 03/03/22 1827 98 %     Weight --      Height --      Head Circumference --      Peak Flow --      Pain Score 03/03/22 1828 8     Pain Loc --      Pain Edu? --      Excl. in [place]? --  No data found.  Updated Vital Signs BP 122/80 (BP Location: Right Arm)   Pulse 92   Temp 98 F (36.7 C) (Oral)   Resp 16   SpO2 98%   Visual Acuity Right Eye Distance:   Left Eye Distance:   Bilateral Distance:    Right Eye Near:   Left Eye Near:    Bilateral Near:     Physical Exam Vitals and nursing note reviewed.  Constitutional:      General: She is not in acute distress.    Appearance: She is obese. She is not toxic-appearing.  Eyes:     General: No scleral icterus.    Conjunctiva/sclera: Conjunctivae normal.  Pulmonary:     Effort: Pulmonary effort is normal.  Musculoskeletal:     Cervical back: Neck supple.  Skin:    Findings: Rash present.     Comments: Fine papules of pink coloration below her nose and around her mouth, which around her mouth per the prior visit picture which sister showed [place], is better. There are no open wounds. I examined her chest where she mentioned she had a rash, and only has a few scattered papules which looks more like heat rash and not fungal.   Neurological:     Mental Status: She is alert and oriented to person, place, and time.     Gait: Gait normal.  Psychiatric:        Mood and Affect: Mood normal.        Behavior: Behavior normal.        Thought Content: Thought content normal.        Judgment: Judgment normal.      UC Treatments / Results  Labs (all labs ordered are listed, but only abnormal results are displayed) Labs Reviewed - No data to display  EKG   Radiology No results found.  Procedures Procedures (including critical care time)  Medications Ordered in UC Medications - No data to display  Initial Impression / Assessment and Plan / UC Course  I have reviewed  the triage vital signs and the nursing notes.  Dr Lanny Cramp examined her as well and he advised to have her try Lotrisone cream.  Her sister was explained if this does not help, pt needs to FU with PCP for derm referral. I sent a few Hydroxyzines as noted.  Final Clinical Impressions(s) / UC Diagnoses   Final diagnoses:  Rash     Discharge Instructions      Try the antifungal cream for 7 days and if it in improving continue for 10-12 days Make an appointment with your primary care doctor so if the rash is not gone, they can send you to dermatology.  There is nothing else we can do for you here if this medication does  not work.      ED Prescriptions     Medication Sig Dispense Auth. Provider   clotrimazole-betamethasone (LOTRISONE) cream Apply to affected area 2 times daily  for 7 days 15 g Rodriguez-Southworth, Sunday Spillers, PA-C   hydrOXYzine (ATARAX) 25 MG tablet Take 1 tablet (25 mg total) by mouth 2 (two) times daily. 10 tablet Rodriguez-Southworth, Sunday Spillers, PA-C      PDMP not reviewed this encounter.   Shelby Mattocks, [place] 03/03/22 1947

## 2022-03-03 NOTE — Discharge Instructions (Signed)
Try the antifungal cream for 7 days and if it in improving continue for 10-12 days Make an appointment with your primary care doctor so if the rash is not gone, they can send you to dermatology.  There is nothing else we can do for you here if this medication does  not work.

## 2022-03-03 NOTE — ED Triage Notes (Signed)
Rash starting back in December. Was seen here and treated in January. States the medications given are not working. Rash is isolated around mouth and in nasolabial folds. Reports the rash to itch and burn.

## 2022-03-06 ENCOUNTER — Telehealth (HOSPITAL_COMMUNITY): Payer: Medicare Other | Admitting: Student in an Organized Health Care Education/Training Program

## 2022-03-06 ENCOUNTER — Encounter (HOSPITAL_COMMUNITY): Payer: Self-pay

## 2022-03-21 ENCOUNTER — Telehealth (HOSPITAL_COMMUNITY): Payer: Medicare Other | Admitting: Student in an Organized Health Care Education/Training Program

## 2022-03-21 DIAGNOSIS — F4325 Adjustment disorder with mixed disturbance of emotions and conduct: Secondary | ICD-10-CM

## 2022-03-21 DIAGNOSIS — F319 Bipolar disorder, unspecified: Secondary | ICD-10-CM

## 2022-03-21 MED ORDER — LAMICTAL 100 MG PO TABS: 100.0000 mg | ORAL_TABLET | Freq: Two times a day (BID) | ORAL | 1 refills | Status: DC

## 2022-03-21 MED ORDER — QUETIAPINE FUMARATE 400 MG PO TABS: 400.0000 mg | ORAL_TABLET | Freq: Every day | ORAL | 1 refills | Status: DC

## 2022-03-21 MED ORDER — BUSPIRONE HCL 15 MG PO TABS: 15.0000 mg | ORAL_TABLET | Freq: Every day | ORAL | 1 refills | Status: DC

## 2022-03-21 MED ORDER — BUSPIRONE HCL 30 MG PO TABS: 30.0000 mg | ORAL_TABLET | Freq: Every day | ORAL | 1 refills | Status: DC

## 2022-03-21 MED ORDER — HYDROXYZINE HCL 25 MG PO TABS: 25.0000 mg | ORAL_TABLET | Freq: Every day | ORAL | 1 refills | Status: DC

## 2022-03-21 NOTE — Progress Notes (Signed)
BH MD/PA/NP OP Progress Note  03/21/2022 8:29 AM Ariel Hernandez  MRN:  [id number]  Chief Complaint: No chief complaint on file.  Virtual Visit via Video Note  I connected with Ariel Hernandez on 03/21/22 at  8:30 AM EST by a video enabled telemedicine application and verified that I am speaking with the correct person using two identifiers.  Location: Patient: Home Provider: Office   I discussed the limitations of evaluation and management by telemedicine and the availability of in person appointments. The patient expressed understanding and agreed to proceed.  History of Present Illness: Ariel Hernandez is a 22 year old patient with a PPH of ADHD, anxiety, insomnia, intermittent explosive disorder, ODD, intellectual disability and bipolar disorder.  Patient reports she is compliant with the following medication regimen:   Seroquel 400 mg nightly Lamictal 100 mg twice daily Hydroxyzine '25mg'$  BID  BuSpar 15 mg in the a.m. and 30 mg nightly   Patient reports that she is doing ok, she ran out of her Hydroxyzine but has otherwise complaint with her meds. Patient reports that she is no longer having as frequent black stools. Patient reports that the rash she has ban be seen on her face, around her nose and on her chest. Patient reports that the rash itches and burns but she is not having loss of skin. Patient reports that she has been given fungal cream, and there has been minimal improvement. Patient reports that the rash was white, red, and dry but is now improving especially around her mouth. Patient reports that her mood has been "good." Patient reports that she is sleeping farily well, estimating she gets approx 5h of sleep but does feel tired during the day. Patient reports that she spends the day cleaning the house, reading, working out, and watching TV. Patient reports she is content but would like to be able to do more. Patient reports that she is looking ito getting involved in  [facility]. Patient reports that her appetite is stable. Patient denies feeling overly hopeless or worthless. Patient denies feeling anxious. Patient denies SI, HI, and AVH.     Patient feels anxiety is not really an issue during the night and would be ok with taking off the QHS Hydroxyzine.    I discussed the assessment and treatment plan with the patient. The patient was provided an opportunity to ask questions and all were answered. The patient agreed with the plan and demonstrated an understanding of the instructions.   The patient was advised to call back or seek an in-person evaluation if the symptoms worsen or if the condition fails to improve as anticipated.  I provided 20 minutes of non-face-to-face time during this encounter.   Freida Busman, MD  Visit Diagnosis: No diagnosis found.  Past Psychiatric History:  Intellectual disability, intermittent explosive disorder, conduct disorder, oppositional defiant disorder, generalized anxiety disorder, adjustment disorder, bipolar disorder unspecified Patient has had prior outpatient psychiatric care with Adele Barthel in [facility] for most of her childhood, was also seen at [facility] at some point - Prior medications include clonidine 0.3 mg (for nightmares) along with current medication regimen, Seroquel 200 mg daily and 400 mg nightly, Lamictal 100 twice daily, Zoloft 100 mg (recently decreased to 50 mg), BuSpar 15 mg 3 times daily. - Patient was in [facility] in 2019-2020   08/2021: Patient Seroquel titrated up from 200 mg to 300 mg and then 400 mg nightly.  All other medications continued at previous dosage.  Patient was having  anxiety about being her own guardian and difficulty sleeping, as well as numerous thoughts in her mind that were concerning to her.   10/2021: Patient doing well medications continued as they were.  12/2021-patient endorsing having black stools and stomach pain, patient Zoloft was discontinued all  other medications continued.  Patient's older sister was somewhat aware of patient's stools, patient was to see a provider if they continued.  Past Medical History:  Past Medical History:  Diagnosis Date   ADHD (attention deficit hyperactivity disorder)    Allergy    Seasonal and animals   Anxiety    Asthma    Depression    [employer] 05/17/2020   Oppositional defiant behavior    Pneumonia    PTSD (post-traumatic stress disorder)    Urinary tract infection    hx;of only once   Vision abnormalities    Hx: wears glasses    Past Surgical History:  Procedure Laterality Date   ADENOIDECTOMY     TOOTH EXTRACTION N/A 10/11/2012   Procedure: EXTRACTIONS  TEETH 5, 12, 21, 28 ;  Surgeon: Gae Bon, DDS;  Location: [facility];  Service: Oral Surgery;  Laterality: N/A;    Family Psychiatric History: Biological mother: Bipolar disorder and substance use disorder    Family History:  Family History  Adopted: Yes    Social History:  Social History   Socioeconomic History   Marital status: Single    Spouse name: Not on file   Number of children: 0   Years of education: Not on file   Highest education level: Not on file  Occupational History   Not on file  Tobacco Use   Smoking status: Never   Smokeless tobacco: Never  Vaping Use   Vaping Use: Never used  Substance and Sexual Activity   Alcohol use: No   Drug use: No   Sexual activity: Never    Birth control/protection: Pill    Comment: Heterosexual  Other Topics Concern   Not on file  Social History Narrative   Not on file   Social Determinants of Health   Financial Resource Strain: Not on file  Food Insecurity: Not on file  Transportation Needs: Not on file  Physical Activity: Not on file  Stress: Not on file  Social Connections: Not on file    Allergies:  Allergies  Allergen Reactions   Fish Allergy    Focalin [Dexmethylphenidate]    Hydrocortisone Rash   Other Diarrhea and Nausea And Vomiting    Fish, milk     Metabolic Disorder Labs: No results found for: "HGBA1C", "MPG" Lab Results  Component Value Date   PROLACTIN 5.5 10/02/2019   Lab Results  Component Value Date   CHOL 213 (H) 10/02/2019   TRIG 187 (H) 10/02/2019   HDL 46 10/02/2019   CHOLHDL 4.6 10/02/2019   VLDL 37 10/02/2019   LDLCALC 130 (H) 10/02/2019   Lab Results  Component Value Date   TSH 2.295 10/02/2019    Therapeutic Level Labs: No results found for: "LITHIUM" No results found for: "VALPROATE" No results found for: "CBMZ"  Current Medications: Current Outpatient Medications  Medication Sig Dispense Refill   albuterol (PROAIR HFA) 108 (90 Base) MCG/ACT inhaler Inhale 2 puffs into the lungs every 4 (four) hours as needed (cough). 36 g 5   albuterol (PROVENTIL) (2.5 MG/3ML) 0.083% nebulizer solution Take 3 mLs (2.5 mg total) by nebulization every 4 (four) hours as needed for wheezing or shortness of breath. 75 mL 2  busPIRone (BUSPAR) 15 MG tablet Take 1 tablet (15 mg total) by mouth daily. 30 tablet 2   busPIRone (BUSPAR) 30 MG tablet Take 1 tablet (30 mg total) by mouth at bedtime. 30 tablet 2   cloNIDine (CATAPRES) 0.3 MG tablet one po qhs     clotrimazole-betamethasone (LOTRISONE) cream Apply to affected area 2 times daily  for 7 days 15 g 0   fluticasone (FLONASE) 50 MCG/ACT nasal spray Place 1 spray into both nostrils daily. 16 mL 11   fluticasone (FLOVENT HFA) 110 MCG/ACT inhaler Inhale 2 puffs into the lungs 2 (two) times daily. USE WITH SPACER. 12 g 5   hydrOXYzine (ATARAX) 25 MG tablet Take 1 tablet (25 mg total) by mouth 2 (two) times daily. 10 tablet 0   LAMICTAL 100 MG tablet Take 1 tablet (100 mg total) by mouth 2 (two) times daily. 60 tablet 3   levonorgestrel-ethinyl estradiol (ALESSE) 0.1-20 MG-MCG tablet Take 1 tablet by mouth daily. Take 21 active pills then discard pack and start next pack. Do not take placebo pills. 28 tablet 15   loratadine (CLARITIN) 10 MG tablet Take 1 tablet (10 mg total)  by mouth daily. 30 tablet 11   MIRALAX 17 GM/SCOOP powder Take 17 g by mouth daily.     montelukast (SINGULAIR) 10 MG tablet Take 1 tablet (10 mg total) by mouth at bedtime. 30 tablet 11   pseudoephedrine (SUDAFED) 60 MG tablet Take 1 tablet (60 mg total) by mouth every 8 (eight) hours as needed for congestion. 30 tablet 0   QUEtiapine (SEROQUEL) 400 MG tablet Take 1 tablet (400 mg total) by mouth at bedtime. 30 tablet 3   sertraline (ZOLOFT) 50 MG tablet Take 1 tablet (50 mg total) by mouth daily. 30 tablet 2   triamcinolone cream (KENALOG) 0.1 % APPLY TO AFFECTED AREA TWICE A DAY 453.6 g 0   No current facility-administered medications for this visit.       Psychiatric Specialty Exam: Review of Systems  Psychiatric/Behavioral:  Negative for dysphoric mood, hallucinations and suicidal ideas. The patient is not nervous/anxious.     There were no vitals taken for this visit.There is no height or weight on file to calculate BMI.  General Appearance: Casual  Eye Contact:  Good  Speech:  Clear and Coherent  Volume:  Normal  Mood:  Euthymic  Affect:  Appropriate  Thought Process:  Coherent  Orientation:  Full (Time, Place, and Person)  Thought Content: Logical   Suicidal Thoughts:  No  Homicidal Thoughts:  No  Memory:  Immediate;   Good Recent;   Good  Judgement:  Fair  Insight:  Fair  Psychomotor Activity:  Normal  Concentration:  Concentration: Good  Recall:  NA  Fund of Knowledge: Fair  Language: Good  Akathisia:  No  Handed:    AIMS (if indicated): not done  Assets:  Communication Skills Desire for Improvement Housing Leisure Time Resilience Social Support  ADL's:  Intact  Cognition: Baseline  Sleep:  Good   Screenings: GAD-7    Flowsheet Row Clinical Support from 08/26/2021 in [facility] Counselor from 07/24/2021 in [facility]  Total GAD-7 Score 8 14      PHQ2-9    Flowsheet Row Counselor from  10/15/2021 in [facility] Counselor from 07/24/2021 in [facility] Office Visit from 05/17/2020 in [facility] Visit from 05/18/2019 in [facility]  PHQ-2 Total Score 2 0 0 1  PHQ-9 Total Score '6 1 2 6      '$ Flowsheet Row ED from 03/03/2022 in [facility] ED from 09/19/2021 in [facility] ED from 03/10/2021 in [facility] [place] No Risk No Risk No Risk        Assessment and Plan:  Ariel Hernandez is a 22 year old patient with a PPH of ADHD, anxiety, insomnia, intermittent explosive disorder, ODD, intellectual disability and bipolar disorder.   Based on assessment today patient appears to be fairly stable.  Patient is having less symptoms of GI bleed since her Zoloft was discontinued, and does not appear to be concerned about these issues any longer we will continue to hold Zoloft.  Patient has been compliant with her medications, but endorses that she is not sure if she needs the hydroxyzine in the daytime due to feeling as though her anxiety is fairly well-controlled with her a.m. medications, we will decrease and consider titrating off completely at next visit however patient endorses right now she still thinks anxiety at night is an occasional problem.  Patient prefers not to have as needed medications at this time, we will keep it scheduled.  Patient overall appears content, but would like to be more productive-we will continue to monitor, not meeting criteria for depression at this time.  Adjustment disorder with anxiety-stable - Continue BuSpar 15 mg in the a.m. and 30 mg nightly -Decrease hydroxyzine to 25 mg nightly  Hx bipolar disorder, currently in remission-stable - Continue Seroquel 400 mg nightly - Continue Lamictal 100 mg twice daily  Follow-up in 3 months  Collaboration of  Care: Collaboration of Care:   Patient/Guardian was advised Release of Information must be obtained prior to any record release in order to collaborate their care with an outside provider. Patient/Guardian was advised if they have not already done so to contact the registration department to sign all necessary forms in order for [place] to release information regarding their care.   Consent: Patient/Guardian gives verbal consent for treatment and assignment of benefits for services provided during this visit. Patient/Guardian expressed understanding and agreed to proceed.   PGY-3 Freida Busman, MD 03/21/2022, 8:29 AM

## 2022-06-13 ENCOUNTER — Encounter: Payer: Self-pay | Admitting: *Deleted

## 2022-06-23 ENCOUNTER — Telehealth (INDEPENDENT_AMBULATORY_CARE_PROVIDER_SITE_OTHER): Payer: Medicare Other | Admitting: Student in an Organized Health Care Education/Training Program

## 2022-06-23 ENCOUNTER — Encounter (HOSPITAL_COMMUNITY): Payer: Self-pay | Admitting: Student in an Organized Health Care Education/Training Program

## 2022-06-23 DIAGNOSIS — F4325 Adjustment disorder with mixed disturbance of emotions and conduct: Secondary | ICD-10-CM

## 2022-06-23 DIAGNOSIS — F319 Bipolar disorder, unspecified: Secondary | ICD-10-CM

## 2022-06-23 MED ORDER — BUSPIRONE HCL 15 MG PO TABS
15.0000 mg | ORAL_TABLET | Freq: Every day | ORAL | 1 refills | Status: DC
Start: 2022-06-23 — End: 2022-09-04

## 2022-06-23 MED ORDER — BUSPIRONE HCL 30 MG PO TABS
30.0000 mg | ORAL_TABLET | Freq: Every day | ORAL | 1 refills | Status: DC
Start: 2022-06-23 — End: 2022-09-04

## 2022-06-23 MED ORDER — HYDROXYZINE HCL 25 MG PO TABS: 25.0000 mg | ORAL_TABLET | Freq: Every day | ORAL | 1 refills | Status: DC

## 2022-06-23 MED ORDER — QUETIAPINE FUMARATE 400 MG PO TABS
400.0000 mg | ORAL_TABLET | Freq: Every day | ORAL | 1 refills | Status: DC
Start: 2022-06-23 — End: 2022-09-04

## 2022-06-23 MED ORDER — LAMICTAL 100 MG PO TABS: 100.0000 mg | ORAL_TABLET | Freq: Two times a day (BID) | ORAL | 1 refills | Status: DC

## 2022-06-23 NOTE — Progress Notes (Signed)
Virtual Visit via Video Note  I connected with Ariel Hernandez on 06/23/22 at  1:00 PM EDT by a video enabled telemedicine application and verified that I am speaking with the correct person using two identifiers.  Location: Patient: Ariel Hernandez Provider: Office   I discussed the limitations of evaluation and management by telemedicine and the availability of in person appointments. The patient expressed understanding and agreed to proceed.    I discussed the assessment and treatment plan with the patient. The patient was provided an opportunity to ask questions and all were answered. The patient agreed with the plan and demonstrated an understanding of the instructions.   The patient was advised to call back or seek an in-person evaluation if the symptoms worsen or if the condition fails to improve as anticipated.  I provided 20 minutes of non-face-to-face time during this encounter.   Ariel Morton, MD  [facility] MD/PA/NP OP Progress Note  06/23/2022 1:25 PM Ariel Hernandez  MRN:  [id number]  Chief Complaint:  Chief Complaint  Patient presents with   Follow-up   HPI: Ariel Hernandez is a 22 year old patient with a PPH of ADHD, anxiety, insomnia, intermittent explosive disorder, ODD, intellectual disability and bipolar disorder.  Patient reports she is compliant with the following medication regimen:   Seroquel 400 mg nightly Lamictal 100 mg twice daily Hydroxyzine 25mg  QHS BuSpar 15 mg in the a.m. and 30 mg nightly  Patient reports that she is currently at [facility], with Ariel Hernandez her employment specialist. Patient does want Ariel Hernandez in the appt. Patient reports that she is doing "ok." Patient reports that she has been looking for housing and apply for jobs. Patient reports she has to find housing by the end of the summer because her sister has to move due to rent increase. Patient reports that her sister is also pregnant, and she is very happy about this. Patient reports that  her mood has been "good" overall. Patient reports that her anxiety is controlled. Patient reports that she will use coping skills when she feels anxious like listen to music, write, watch a TV, or read a book. Patient reports she has not had any issues with irritability.   Patient reports that her sleep has been slightly better. Patient reports that she is has been having more nightmares lately, and they may be from things that happened in real life. Patient reports that they are making it hard for her to fall asleep and stay asleep. Patient reports that her appetite is good. Patient denies SI, HI, and AVH.  Visit Diagnosis:    ICD-10-CM   1. Adjustment disorder with mixed disturbance of emotions and conduct  F43.25 busPIRone (BUSPAR) 30 MG tablet    2. Bipolar affective disorder, remission status unspecified (HCC)  F31.9 busPIRone (BUSPAR) 30 MG tablet    busPIRone (BUSPAR) 15 MG tablet    QUEtiapine (SEROQUEL) 400 MG tablet    hydrOXYzine (ATARAX) 25 MG tablet    LAMICTAL 100 MG tablet      Past Psychiatric History: Intellectual disability, intermittent explosive disorder, conduct disorder, oppositional defiant disorder, generalized anxiety disorder, adjustment disorder, bipolar disorder unspecified Patient has had prior outpatient psychiatric care with Lovina Reach in [facility] for most of her childhood, was also seen at [facility] at some point - Prior medications include clonidine 0.3 mg (for nightmares) along with current medication regimen, Seroquel 200 mg daily and 400 mg nightly, Lamictal 100 twice daily, Zoloft 100 mg (recently decreased to 50 mg), BuSpar  15 mg 3 times daily. - [facility] was in [facility] in 2019-2020   08/2021: Patient Seroquel titrated up from 200 mg to 300 mg and then 400 mg nightly.  All other medications continued at previous dosage.  Patient was having anxiety about being her own guardian and difficulty sleeping, as well as numerous thoughts in her mind that  were concerning to her.   10/2021: Patient doing well medications continued as they were.   12/2021-patient endorsing having black stools and stomach pain, patient Zoloft was discontinued all other medications continued.  Patient's older sister was somewhat aware of patient's stools, patient was to see a provider if they continued.  03/2022- Decreased Hydroxyzine to 25mg  QHS, all other meds stayed same. Anxiety and bipolar symptoms were stable.   Past Medical History:  Past Medical History:  Diagnosis Date   ADHD (attention deficit hyperactivity disorder)    Allergy    Seasonal and animals   Anxiety    Asthma    Depression    Phreesia 05/17/2020   Oppositional defiant behavior    Pneumonia    PTSD (post-traumatic stress disorder)    Urinary tract infection    hx;of only once   Vision abnormalities    Hx: wears glasses    Past Surgical History:  Procedure Laterality Date   ADENOIDECTOMY     TOOTH EXTRACTION N/A 10/11/2012   Procedure: EXTRACTIONS  TEETH 5, 12, 21, 28 ;  Surgeon: [name], DDS;  Location: [facility] OR;  Service: Oral Surgery;  Laterality: N/A;    Family Psychiatric History: Biological mother: Bipolar disorder and substance use disorder    Family History:  Family History  Adopted: Yes    Social History:  Social History   Socioeconomic History   Marital status: Single    Spouse name: Not on file   Number of children: 0   Years of education: Not on file   Highest education level: Not on file  Occupational History   Not on file  Tobacco Use   Smoking status: Never   Smokeless tobacco: Never  Vaping Use   Vaping Use: Never used  Substance and Sexual Activity   Alcohol use: No   Drug use: No   Sexual activity: Never    Birth control/protection: Pill    Comment: Heterosexual  Other Topics Concern   Not on file  Social History Narrative   Not on file   Social Determinants of Health   Financial Resource Strain: Not on file  Food Insecurity: Not  on file  Transportation Needs: Not on file  Physical Activity: Not on file  Stress: Not on file  Social Connections: Not on file    Allergies:  Allergies  Allergen Reactions   Fish Allergy    Focalin [Dexmethylphenidate]    Hydrocortisone Rash   Other Diarrhea and Nausea And Vomiting    Fish, milk    Metabolic Disorder Labs: No results found for: "HGBA1C", "MPG" Lab Results  Component Value Date   PROLACTIN 5.5 10/02/2019   Lab Results  Component Value Date   CHOL 213 (H) 10/02/2019   TRIG 187 (H) 10/02/2019   HDL 46 10/02/2019   CHOLHDL 4.6 10/02/2019   VLDL 37 10/02/2019   LDLCALC 130 (H) 10/02/2019   Lab Results  Component Value Date   TSH 2.295 10/02/2019    Therapeutic Level Labs: No results found for: "LITHIUM" No results found for: "VALPROATE" No results found for: "CBMZ"  Current Medications: Current Outpatient Medications  Medication Sig Dispense Refill   albuterol (PROAIR HFA) 108 (90 Base) MCG/ACT inhaler Inhale 2 puffs into the lungs every 4 (four) hours as needed (cough). 36 g 5   albuterol (PROVENTIL) (2.5 MG/3ML) 0.083% nebulizer solution Take 3 mLs (2.5 mg total) by nebulization every 4 (four) hours as needed for wheezing or shortness of breath. 75 mL 2   busPIRone (BUSPAR) 15 MG tablet Take 1 tablet (15 mg total) by mouth daily. 90 tablet 1   busPIRone (BUSPAR) 30 MG tablet Take 1 tablet (30 mg total) by mouth at bedtime. 90 tablet 1   cloNIDine (CATAPRES) 0.3 MG tablet one po qhs     clotrimazole-betamethasone (LOTRISONE) cream Apply to affected area 2 times daily  for 7 days 15 g 0   fluticasone (FLONASE) 50 MCG/ACT nasal spray Place 1 spray into both nostrils daily. 16 mL 11   fluticasone (FLOVENT HFA) 110 MCG/ACT inhaler Inhale 2 puffs into the lungs 2 (two) times daily. USE WITH SPACER. 12 g 5   hydrOXYzine (ATARAX) 25 MG tablet Take 1 tablet (25 mg total) by mouth daily. 90 tablet 1   LAMICTAL 100 MG tablet Take 1 tablet (100 mg total) by  mouth 2 (two) times daily. 180 tablet 1   levonorgestrel-ethinyl estradiol (ALESSE) 0.1-20 MG-MCG tablet Take 1 tablet by mouth daily. Take 21 active pills then discard pack and start next pack. Do not take placebo pills. 28 tablet 15   loratadine (CLARITIN) 10 MG tablet Take 1 tablet (10 mg total) by mouth daily. 30 tablet 11   MIRALAX 17 GM/SCOOP powder Take 17 g by mouth daily.     montelukast (SINGULAIR) 10 MG tablet Take 1 tablet (10 mg total) by mouth at bedtime. 30 tablet 11   pseudoephedrine (SUDAFED) 60 MG tablet Take 1 tablet (60 mg total) by mouth every 8 (eight) hours as needed for congestion. 30 tablet 0   QUEtiapine (SEROQUEL) 400 MG tablet Take 1 tablet (400 mg total) by mouth at bedtime. 90 tablet 1   triamcinolone cream (KENALOG) 0.1 % APPLY TO AFFECTED AREA TWICE A DAY 453.6 g 0   No current facility-administered medications for this visit.    Psychiatric Specialty Exam: Review of Systems  Psychiatric/Behavioral:  Negative for dysphoric mood, hallucinations and suicidal ideas. The patient is not nervous/anxious.     There were no vitals taken for this visit.There is no height or weight on file to calculate BMI.  General Appearance: Casual where pink glittery eye shadow and 3 pink acne stars on forehead  Eye Contact:  Good  Speech:  Clear and Coherent  Volume:  Normal  Mood:  Euthymic  Affect:  Appropriate  Thought Process:  Coherent  Orientation:  Full (Time, Place, and Person)  Thought Content: Logical   Suicidal Thoughts:  No  Homicidal Thoughts:  No  Memory:  Immediate;   Good Recent;   Good  Judgement:  Good  Insight:  Fair  Psychomotor Activity:  NA  Concentration:  Concentration: Good  Recall:  NA  Fund of Knowledge: Good  Language: Good  Akathisia:  No  Handed:    AIMS (if indicated): not done  Assets:  Communication Skills Desire for Improvement Housing Resilience Social Support  ADL's:  Intact  Cognition: WNL  Sleep:  Fair    Screenings: GAD-7    Flowsheet Row Clinical Support from 08/26/2021 in [facility] Counselor from 07/24/2021 in [facility]  Total GAD-7 Score 8 14  [id number]    Flowsheet Row Counselor from 10/15/2021 in [facility] Counselor from 07[facility]24/2021 in [facility] Office Visit from 05/17/2020 in [facility] Office Visit from 05/18/2019 in [facility]  PHQ-2 Total Score 2 0 0 1  PHQ-9 Total Score 6 1 2 6       Flowsheet Row ED from 03/03/2022 in [facility] from 09/19/2021 in [facility] ED from 03/10/2021 in [facility] RISK CATEGORY No Risk No Risk No Risk        Assessment and Plan:  Overall, patient continues to endorse feeling stable and not having any significant mood disturbances or events.  Patient does endorse some increase in nightmares and difficulty with sleeping, this may be secondary to patient having multiple life-changing events in the last few weeks however, patient overall has an optimistic attitude towards the change and is using coping skills appropriately. Patient would like to use sleep hygiene to work on her nightmares and sleep right now. Did discuss trazodone but it does has not helped in the past as and prazosin, but patient endorsed that she does not want to start a new medication.  [facility]  Discussed with patient pending transition of care to a new resident starting July 1st, and likely discontinuation of care by this provider at that time.    Adjustment disorder with anxiety-stable - Continue BuSpar 15 mg in the a.m. and 30 mg nightly - continue hydroxyzine to 25 mg nightly  Hx bipolar disorder, currently in remission-stable - Continue Seroquel 400 mg nightly - Continue  Lamictal 100 mg twice daily  Follow-up in approximately 2 months with Dr. Jerrel Ivory Follow-up with Idalia Needle for therapy 06/24/2022  Collaboration of Care: Collaboration of Care:   Patient/Guardian was advised Release of Information must be obtained prior to any record release in order to collaborate their care with an outside provider. Patient/Guardian was advised if they have not already done so to contact the registration department to sign all necessary forms in order for [place] to release information regarding their care.   Consent: Patient/Guardian gives verbal consent for treatment and assignment of benefits for services provided during this visit. Patient/Guardian expressed understanding and agreed to proceed.   PGY-3 Ariel Morton, MD 06/23/2022, 1:25 PM

## 2022-06-23 NOTE — Addendum Note (Signed)
Addended by: Eliseo Gum B on: 06/23/2022 01:38 PM   Modules accepted: Level of Service

## 2022-06-24 ENCOUNTER — Ambulatory Visit (INDEPENDENT_AMBULATORY_CARE_PROVIDER_SITE_OTHER): Payer: Medicare Other | Admitting: Clinical

## 2022-06-24 DIAGNOSIS — F319 Bipolar disorder, unspecified: Secondary | ICD-10-CM

## 2022-06-24 NOTE — Progress Notes (Signed)
THERAPIST PROGRESS NOTE Virtual Visit via Video Note  I connected with Ariel Hernandez on 06/24/22 at 11:00 AM EDT by a video enabled telemedicine application and verified that I am speaking with the correct person using two identifiers.  Location: Patient: home Provider: office   I discussed the limitations of evaluation and management by telemedicine and the availability of in person appointments. The patient expressed understanding and agreed to proceed.   Follow Up Instructions: I discussed the assessment and treatment plan with the patient. The patient was provided an opportunity to ask questions and all were answered. The patient agreed with the plan and demonstrated an understanding of the instructions.   The patient was advised to call back or seek an in-person evaluation if the symptoms worsen or if the condition fails to improve as anticipated.   Session Time: 30 minutes  Participation Level: Active  Behavioral Response: CasualAlertEuthymic  Type of Therapy: Individual Therapy  Treatment Goals addressed: client will reduce overall depression score by a minimum of 25% on the PHQ9 questionnaire   ProgressTowards Goals: Progressing  Interventions: CBT and Supportive  Summary:  Ariel Hernandez is a 22 y.o. female who presents for the scheduled appointment oriented times five, appropriately dressed and friendly. Client denied hallucinations and delusions. Client reported on today a lot has changed since she was last seen. Client reported since she turned 22 she has been able to start at the [facility]. Client reported the program was not what she thought it would be. Client reported she has connected with a employment and housing specialist there. Client reported she is still living with her sisters and her family. Client reported she does have to find a place to stay by the end of summer because her sister is planning to move. Client reported the rent has gone up  and she is looking for another house. Client reported her sister is also expecting a bay. Client reported she has not seen her parents as frequently which has helped her to be more comfortable. Client reported she is done with parole/ post release since March. Client reported she is nervous that her record is preventing her from having a job position.  Evidence of progress towards goal:  client reported 1 positive which is reframing negative thoughts about stressors.  Suicidal/Homicidal: Nowithout intent/plan  Therapist Response:  Therapist began the appointment asking the client how he has been doing since last seen. Therapist used CBT to engage using active listening and positive emotional support. Therapist used CBT to engage and ask the client about changes to psychosocial stressors.  Therapist used CBT to engage and ask the client open ended questions about how she is coping with the anticipated changes. Therapist used CBT to discuss creating a positive support system. Therapist used CBT ask the client to identify her progress with frequency of use with coping skills with continued practice in her daily activity.    Therapist assigned the client homework to practice self care.   Plan: Return again in 4 weeks.  Diagnosis: bipolar affective disorder, remission status unspecified  Collaboration of Care: Patient refused AEB none requested by th client.  Patient/Guardian was advised Release of Information must be obtained prior to any record release in order to collaborate their care with an outside provider. Patient/Guardian was advised if they have not already done so to contact the registration department to sign all necessary forms in order for [place] to release information regarding their care.   Consent: Patient/Guardian gives  verbal consent for treatment and assignment of benefits for services provided during this visit. Patient/Guardian expressed understanding and agreed to proceed.    Ariel Rhymes Jahn Franchini, LCSW 06/24/2022

## 2022-07-07 ENCOUNTER — Other Ambulatory Visit (HOSPITAL_COMMUNITY): Payer: Self-pay

## 2022-07-07 ENCOUNTER — Telehealth (HOSPITAL_COMMUNITY): Payer: Self-pay

## 2022-07-08 ENCOUNTER — Other Ambulatory Visit (HOSPITAL_COMMUNITY): Payer: Self-pay | Admitting: Student

## 2022-07-29 ENCOUNTER — Ambulatory Visit (INDEPENDENT_AMBULATORY_CARE_PROVIDER_SITE_OTHER): Payer: Medicare Other | Admitting: Clinical

## 2022-07-29 DIAGNOSIS — F319 Bipolar disorder, unspecified: Secondary | ICD-10-CM

## 2022-07-29 NOTE — Progress Notes (Signed)
   THERAPIST PROGRESS NOTE Virtual Visit via Video Note  I connected with Ariel Hernandez on 07/29/2022 at  3:00 PM EDT by a video enabled telemedicine application and verified that I am speaking with the correct person using two identifiers.  Location: Patient: home Provider: office   I discussed the limitations of evaluation and management by telemedicine and the availability of in person appointments. The patient expressed understanding and agreed to proceed.   Follow Up Instructions: I discussed the assessment and treatment plan with the patient. The patient was provided an opportunity to ask questions and all were answered. The patient agreed with the plan and demonstrated an understanding of the instructions.   The patient was advised to call back or seek an in-person evaluation if the symptoms worsen or if the condition fails to improve as anticipated.   Session Time: 30 minutes  Participation Level: Active  Behavioral Response: CasualAlertEuthymic  Type of Therapy: Individual Therapy  Treatment Goals addressed: client will complete 80% of homework  ProgressTowards Goals: Progressing  Interventions: CBT and Supportive  Summary:  Ariel Hernandez is a 22 y.o. female who presents for the scheduled appointment oriented times five, appropriately dressed and friendly. Client denied hallucinations and delusions. Client reported on today she is doing well. Client reported she is working with specialist at [employer] for employment. Client reported she went out with the worker to talk to employers about potential hiring. Client reported she had good conversations and may hear back from a few. Client reported no result from housing yet but is still looking. Client reported her sister will let her live with her for awhile until the baby comes. Client reported her depressive and anxiety symptoms come from stressors but it is managable. Client reported she has no other complaints  at this time. Evidence of progress towards goal:  client reported she is working with a specialist 5 days a week to locate employment.    Suicidal/Homicidal: Nowithout intent/plan  Therapist Response:  Therapist began the appointment asking the client how she has been doing. Therapist used CBT to engage using active listening and positive emotional support. Therapist used CBT to engage and ask the client about psychosocial changes. Therapist used CBT to positively reinforce her involvement in engaging with services for employment and housing. Therapist used CBT ask the client to identify her progress with frequency of use with coping skills with continued practice in her daily activity.    Therapist assigned the client homework to practice self care.    Plan: Return again in 4 weeks.  Diagnosis: bipolar affective disorder, remission status unspecified  Collaboration of Care: Patient refused AEB none requested by the client.  Patient/Guardian was advised Release of Information must be obtained prior to any record release in order to collaborate their care with an outside provider. Patient/Guardian was advised if they have not already done so to contact the registration department to sign all necessary forms in order for [place] to release information regarding their care.   Consent: Patient/Guardian gives verbal consent for treatment and assignment of benefits for services provided during this visit. Patient/Guardian expressed understanding and agreed to proceed.   Ariel Rhymes Sylvan Sookdeo, LCSW 07/29/2022

## 2022-08-15 ENCOUNTER — Encounter (HOSPITAL_COMMUNITY): Payer: Self-pay

## 2022-08-15 ENCOUNTER — Ambulatory Visit (HOSPITAL_COMMUNITY): Payer: Medicare Other | Admitting: Clinical

## 2022-08-20 ENCOUNTER — Ambulatory Visit (INDEPENDENT_AMBULATORY_CARE_PROVIDER_SITE_OTHER): Payer: Medicare Other | Admitting: Clinical

## 2022-08-20 DIAGNOSIS — F319 Bipolar disorder, unspecified: Secondary | ICD-10-CM | POA: Diagnosis not present

## 2022-08-20 NOTE — Progress Notes (Signed)
THERAPIST PROGRESS NOTE Virtual Visit via Video Note  I connected with Ariel Hernandez on 08/20/22 at  4:00 PM EDT by a video enabled telemedicine application and verified that I am speaking with the correct person using two identifiers.  Location: Patient: home Provider: office   I discussed the limitations of evaluation and management by telemedicine and the availability of in person appointments. The patient expressed understanding and agreed to proceed.   Follow Up Instructions: I discussed the assessment and treatment plan with the patient. The patient was provided an opportunity to ask questions and all were answered. The patient agreed with the plan and demonstrated an understanding of the instructions.   The patient was advised to call back or seek an in-person evaluation if the symptoms worsen or if the condition fails to improve as anticipated.   Session Time: 20 minutes  Participation Level: Active  Behavioral Response: CasualAlertEuthymic  Type of Therapy: Individual Therapy  Treatment Goals addressed: client will score less than a 10 on the PHQ9 questionnaire   ProgressTowards Goals: Progressing  Interventions: CBT and Supportive  Summary:  Ariel Hernandez is a 22 y.o. female who presents for the scheduled appointment oriented x 5, appropriately dressed, friendly.  Client denied hallucinations and delusions. Client reported on today she is doing well.  Client reported she is currently staying at home alone while her sister is out of town.  Client reported she is having some anxiety about trying to find housing and secure employment for herself.  Client reported her sister is expecting her baby in December.  Client reported her sister stated to her that when she moves she can stay with her up to 2 weeks but then she will need to move onto her own place due to having limited space for her expanding family.  Client reported alternatively she does have some good  news that she was asked to come in for follow-up interview to [place] the mall.  Client reported that she is nervous about doing well but is remaining optimistic that although she may not get the job she had exposure to know what to expect for a job interview.  Client reported overall her depression and anxiety are very minimal as it pertains to situational stressors.  Client reported however she does have generalized worry thinking about her family but it is not a distraction to her daily activity. Evidence of progress towards goal: Client reported 1 improvement towards a goal of employment by having a follow-up in person interview next week.  Client GAD-7 and PHQ9 score is under 5.    08/20/2022   10:53 AM 08/26/2021    1:56 PM 07/24/2021   10:35 AM  GAD 7 : Generalized Anxiety Score  Nervous, Anxious, on Edge 1 1 2   Control/stop worrying 1 3 2   Worry too much - different things 0 3 2  Trouble relaxing 0 0 2  Restless 0 0 2  Easily annoyed or irritable 0 0 2  Afraid - awful might happen 0 1 2  Total GAD 7 Score 2 8 14   Anxiety Difficulty Somewhat difficult Not difficult at all Very difficult   Flowsheet Row Counselor from 08/20/2022 in [facility]  PHQ-9 Total Score 1        Suicidal/Homicidal: Nowithout intent/plan  Therapist Response:  Therapist began the appointment asking client how she has been doing since last seen. Therapist used CBT to engage using active listening and positive emotional support. Therapist  used CBT to address the client about changes to psychosocial stressors. Therapist used CBT to have the client to identify how she is coping with managing her stressors and utilizing resources to help with her needs being met. Therapist used CBT to positively reinforce the clients assertiveness to working what her goals as well as reinforcing and discussing positive communication skills. Therapist used CBT ask the client to identify her progress with  frequency of use with coping skills with continued practice in her daily activity.    Therapist assigned the client homework to practice self care.   Plan: Return again in 4 weeks.  Diagnosis: Bipolar affective disorder, remission status unspecified  Collaboration of Care: Other therapist will complete paperwork for the client transportation system through [employer] [place].  Patient/Guardian was advised Release of Information must be obtained prior to any record release in order to collaborate their care with an outside provider. Patient/Guardian was advised if they have not already done so to contact the registration department to sign all necessary forms in order for [place] to release information regarding their care.   Consent: Patient/Guardian gives verbal consent for treatment and assignment of benefits for services provided during this visit. Patient/Guardian expressed understanding and agreed to proceed.   Neena Rhymes Rajah Lamba, LCSW 08/20/2022

## 2022-09-04 ENCOUNTER — Encounter (HOSPITAL_COMMUNITY): Payer: Self-pay | Admitting: Student

## 2022-09-04 ENCOUNTER — Telehealth (INDEPENDENT_AMBULATORY_CARE_PROVIDER_SITE_OTHER): Payer: Medicare Other | Admitting: Student

## 2022-09-04 DIAGNOSIS — F605 Obsessive-compulsive personality disorder: Secondary | ICD-10-CM

## 2022-09-04 DIAGNOSIS — Z0282 Encounter for adoption services: Secondary | ICD-10-CM | POA: Insufficient documentation

## 2022-09-04 DIAGNOSIS — Z789 Other specified health status: Secondary | ICD-10-CM | POA: Insufficient documentation

## 2022-09-04 DIAGNOSIS — F4310 Post-traumatic stress disorder, unspecified: Secondary | ICD-10-CM

## 2022-09-04 DIAGNOSIS — F411 Generalized anxiety disorder: Secondary | ICD-10-CM

## 2022-09-04 DIAGNOSIS — F39 Unspecified mood [affective] disorder: Secondary | ICD-10-CM | POA: Diagnosis not present

## 2022-09-04 MED ORDER — QUETIAPINE FUMARATE 400 MG PO TABS
400.0000 mg | ORAL_TABLET | Freq: Every day | ORAL | 2 refills | Status: DC
Start: 2022-09-04 — End: 2022-10-16

## 2022-09-04 MED ORDER — LAMOTRIGINE 100 MG PO TABS
100.0000 mg | ORAL_TABLET | Freq: Two times a day (BID) | ORAL | 2 refills | Status: DC
Start: 2022-09-04 — End: 2022-10-16

## 2022-09-04 MED ORDER — HYDROXYZINE HCL 25 MG PO TABS: 25.0000 mg | ORAL_TABLET | Freq: Every day | ORAL | 1 refills | Status: DC | PRN

## 2022-09-04 MED ORDER — BUSPIRONE HCL 30 MG PO TABS: 30.0000 mg | ORAL_TABLET | Freq: Two times a day (BID) | ORAL | 1 refills | Status: DC
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
## 2022-09-05 NOTE — Addendum Note (Signed)
Addended byPrincess Bruins on: 09/05/2022 01:51 PM   Modules accepted: Level of Service

## 2022-09-11 ENCOUNTER — Telehealth (HOSPITAL_COMMUNITY): Payer: Medicare Other | Admitting: Student

## 2022-09-12 NOTE — Addendum Note (Signed)
Addended by: Theodoro Kos A on: 09/12/2022 08:48 AM   Modules accepted: Level of Service

## 2022-09-19 ENCOUNTER — Other Ambulatory Visit (HOSPITAL_COMMUNITY): Payer: Self-pay | Admitting: Student

## 2022-09-19 DIAGNOSIS — F411 Generalized anxiety disorder: Secondary | ICD-10-CM

## 2022-10-01 ENCOUNTER — Ambulatory Visit (INDEPENDENT_AMBULATORY_CARE_PROVIDER_SITE_OTHER): Payer: Medicare Other | Admitting: Clinical

## 2022-10-01 DIAGNOSIS — F411 Generalized anxiety disorder: Secondary | ICD-10-CM

## 2022-10-01 NOTE — Progress Notes (Signed)
THERAPIST PROGRESS NOTE Virtual Visit via Video Note  I connected with Sharicka Laury Axon on 10/01/2022 at  4:00 PM EDT by a video enabled telemedicine application and verified that I am speaking with the correct person using two identifiers.  Location: Patient: hotel Provider: office   I discussed the limitations of evaluation and management by telemedicine and the availability of in person appointments. The patient expressed understanding and agreed to proceed.   Follow Up Instructions: I discussed the assessment and treatment plan with the patient. The patient was provided an opportunity to ask questions and all were answered. The patient agreed with the plan and demonstrated an understanding of the instructions.   The patient was advised to call back or seek an in-person evaluation if the symptoms worsen or if the condition fails to improve as anticipated.   Session Time: 45 minutes  Participation Level: Active  Behavioral Response: CasualAlertAnxious  Type of Therapy: Individual Therapy  Treatment Goals addressed: Client will reduce overall depression score evaluated Focusing on the PHQ-9 rating scale  ProgressTowards Goals: Progressing  Interventions: CBT and Supportive  Summary:  Soniya MACKENZEE GOULD is a 22 y.o. female who presents for the scheduled appointment oriented time, appropriately dressed and friendly. Client denied hallucinations and delusions. Client reported on today she is now staying in between motels. Client reported since she was last seen her sister and her family moved out of their home into a new place. Client reported a plan to transition her sister told her that there was no room for her stuff. Client reported the initial plan was to allow for her to live with her for a little bit before hopefully find a place of her own. Client reported the way that her sister told her was very hurtful. Client reported her sister was blaming her for why things are how  they are and cursing at her. Client reported it hurts her feelings to feel and know that her family does not want her around. Client reported since her sister told her to work on finding a job and her own place she began looking. Client reported [facility] now.  Client reported her brother who lives in [place] has offered to fly her out and have her stay with him for a while.  Client reported he to confirms that however the rest of the family has acted is not quite as well.  Client reported she has never been on the plane but ultimately she thinks a change of scenery will be good for her. Evidence of progress towards goal: Client reported 1 positive which is having support from a community resource and a family member who is helpful to her and keeps her motivated.  Suicidal/Homicidal: Nowithout intent/plan  Therapist Response:  Therapist began the appointment asking the client how she has been doing since last seen. Therapist used CBT to engage using active listening and positive emotional support towards her thoughts and feelings. Therapist used CBT to ask the client open-ended questions about changes to her psychosocial stressors. Therapist used CBT to engage and normalize the clients emotional response and validate her feelings. Therapist used CBT to engage the client to identify some positive to help her stay motivated as she goes through this transition. Therapist used CBT ask the client to identify her progress with frequency of use with coping skills with continued practice in her daily activity.    Therapist assigned client homework to practice self-care.  Plan: Return again in 4 weeks.  Diagnosis: Generalized anxiety disorder  Collaboration of Care: Patient refused AEB none requested by the client.  Patient/Guardian was advised Release of Information must be obtained prior to any record release in order to collaborate their care with an  outside provider. Patient/Guardian was advised if they have not already done so to contact the registration department to sign all necessary forms in order for [place] to release information regarding their care.   Consent: Patient/Guardian gives verbal consent for treatment and assignment of benefits for services provided during this visit. Patient/Guardian expressed understanding and agreed to proceed.   Neena Rhymes Maysoon Lozada, LCSW 10/01/2022

## 2022-10-16 ENCOUNTER — Encounter (HOSPITAL_COMMUNITY): Payer: Medicare Other | Admitting: Student

## 2022-10-16 DIAGNOSIS — F4310 Post-traumatic stress disorder, unspecified: Secondary | ICD-10-CM

## 2022-10-16 DIAGNOSIS — F411 Generalized anxiety disorder: Secondary | ICD-10-CM

## 2022-10-16 DIAGNOSIS — F39 Unspecified mood [affective] disorder: Secondary | ICD-10-CM

## 2022-10-16 MED ORDER — BUSPIRONE HCL 30 MG PO TABS
30.0000 mg | ORAL_TABLET | Freq: Two times a day (BID) | ORAL | 2 refills | Status: AC
Start: 2022-10-16 — End: 2023-01-14

## 2022-10-16 MED ORDER — QUETIAPINE FUMARATE 400 MG PO TABS
400.0000 mg | ORAL_TABLET | Freq: Every day | ORAL | 2 refills | Status: DC
Start: 2022-10-16 — End: 2023-01-22

## 2022-10-16 MED ORDER — HYDROXYZINE HCL 25 MG PO TABS
25.0000 mg | ORAL_TABLET | Freq: Every day | ORAL | 1 refills | Status: DC | PRN
Start: 2022-10-16 — End: 2023-04-23

## 2022-10-16 MED ORDER — LAMOTRIGINE 100 MG PO TABS: 100.0000 mg | ORAL_TABLET | Freq: Two times a day (BID) | ORAL | 2 refills | Status: DC

## 2022-10-16 NOTE — Progress Notes (Signed)
Encounter error.  Patient was in [place] when called for virtual visit.  Informed her that I [place] practice [place], however am able to provide refills of her rx to current pharmacy which she could call to transfer to a pharmacy near her. She was amenable to this.   Provided refills to bridge until next appointment

## 2022-10-28 ENCOUNTER — Telehealth (HOSPITAL_COMMUNITY): Payer: Self-pay | Admitting: *Deleted

## 2022-10-28 NOTE — Telephone Encounter (Signed)
Patient called states that she needs her Lamictal sent to the [facility] in [place] since she is visiting family until Nov and forgot her medication.

## 2023-01-19 NOTE — Telephone Encounter (Signed)
done

## 2023-01-22 ENCOUNTER — Encounter (HOSPITAL_COMMUNITY): Payer: Self-pay | Admitting: Student

## 2023-01-22 ENCOUNTER — Telehealth (INDEPENDENT_AMBULATORY_CARE_PROVIDER_SITE_OTHER): Payer: Medicare Other | Admitting: Student

## 2023-01-22 DIAGNOSIS — F4310 Post-traumatic stress disorder, unspecified: Secondary | ICD-10-CM

## 2023-01-22 DIAGNOSIS — F411 Generalized anxiety disorder: Secondary | ICD-10-CM | POA: Diagnosis not present

## 2023-01-22 DIAGNOSIS — F39 Unspecified mood [affective] disorder: Secondary | ICD-10-CM

## 2023-01-22 DIAGNOSIS — F605 Obsessive-compulsive personality disorder: Secondary | ICD-10-CM | POA: Diagnosis not present

## 2023-01-22 MED ORDER — LAMOTRIGINE 200 MG PO TABS: 200.0000 mg | ORAL_TABLET | Freq: Every day | ORAL | 2 refills | Status: DC

## 2023-01-22 MED ORDER — QUETIAPINE FUMARATE 400 MG PO TABS: 400.0000 mg | ORAL_TABLET | Freq: Every day | ORAL | 2 refills | Status: DC

## 2023-01-22 MED ORDER — LAMOTRIGINE 200 MG PO TABS
200.0000 mg | ORAL_TABLET | Freq: Every day | ORAL | 2 refills | Status: DC
Start: 2023-01-22 — End: 2023-01-22

## 2023-01-22 MED ORDER — BUSPIRONE HCL 30 MG PO TABS: 30.0000 mg | ORAL_TABLET | Freq: Two times a day (BID) | ORAL | 2 refills | Status: DC

## 2023-01-22 MED ORDER — QUETIAPINE FUMARATE 400 MG PO TABS
400.0000 mg | ORAL_TABLET | Freq: Every day | ORAL | 2 refills | Status: DC
Start: 2023-01-22 — End: 2023-01-22

## 2023-01-22 MED ORDER — BUSPIRONE HCL 30 MG PO TABS: 30.0000 mg | ORAL_TABLET | Freq: Two times a day (BID) | ORAL | 1 refills | Status: DC
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
## 2023-01-23 IMAGING — CT CT HEAD W/O CM
3 of 4 series · 14 of 47 positions shown, 16 images · non-contrast
Comparison: None.

CLINICAL DATA: Patient struck head on concrete. Moderate to severe
head trauma.

EXAM:
CT HEAD WITHOUT CONTRAST
TECHNIQUE: Contiguous axial images were obtained from the base of the skull
through the vertex without intravenous contrast.

[Series 4: coronal soft tissue · coronal · 0.33mm/px · 3 of 73 slices shown]
[im 25/73  brain]
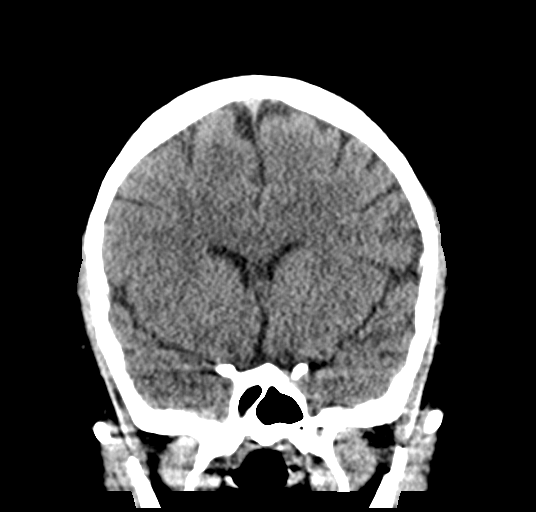
[im 33/73  brain]
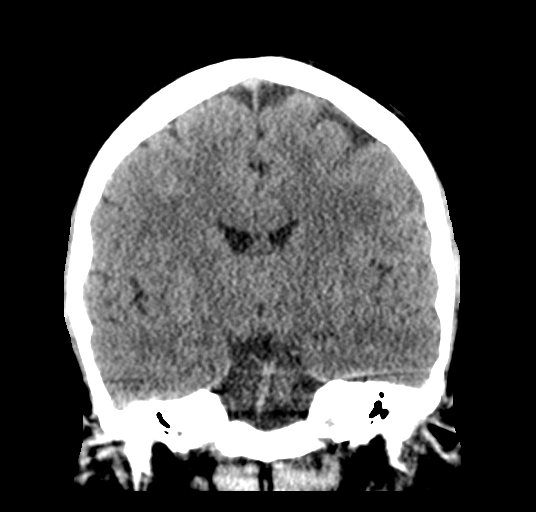
[im 41/73  brain]
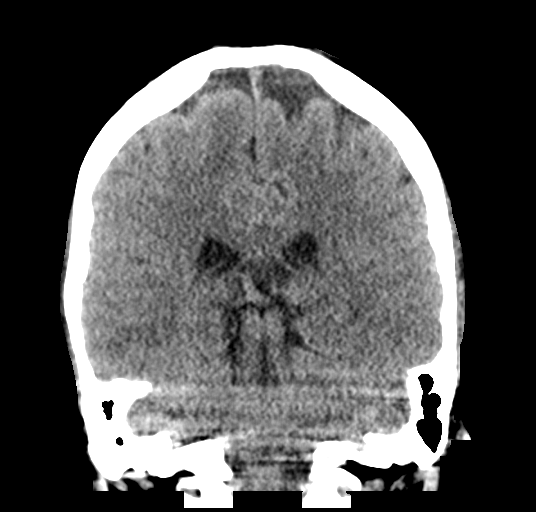

[Series 5: sagittal soft tissue · sagittal · 0.32mm/px · 3 of 56 slices shown]
[im 19/56  brain]
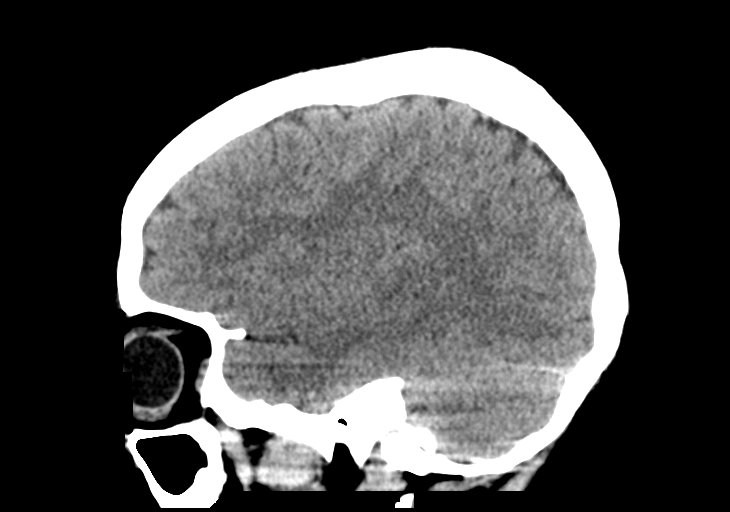
[im 28/56  brain]
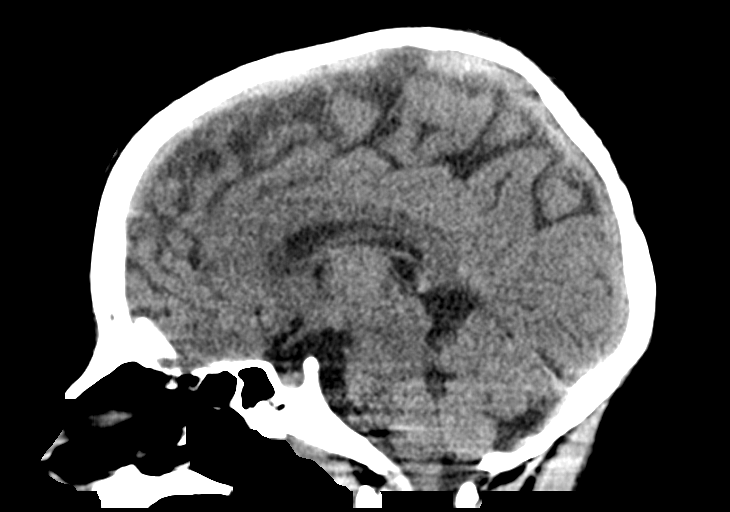
[im 37/56  brain]
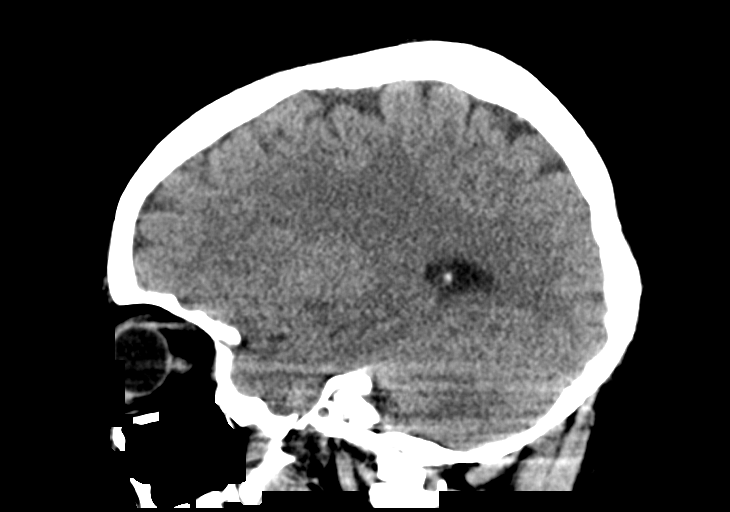

[Series 7: axial soft tissue · axial · 0.35mm/px · z∈[-187,-61]mm · 8 of 55 slices shown, 10 images]
[im 7/55  brain]
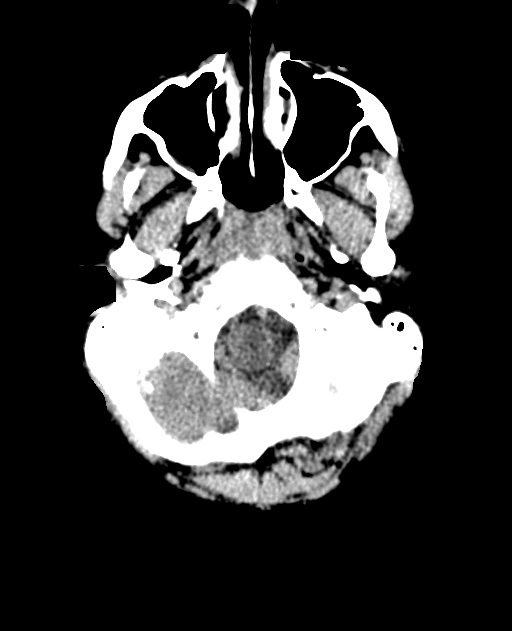
[im 7/55  bone]
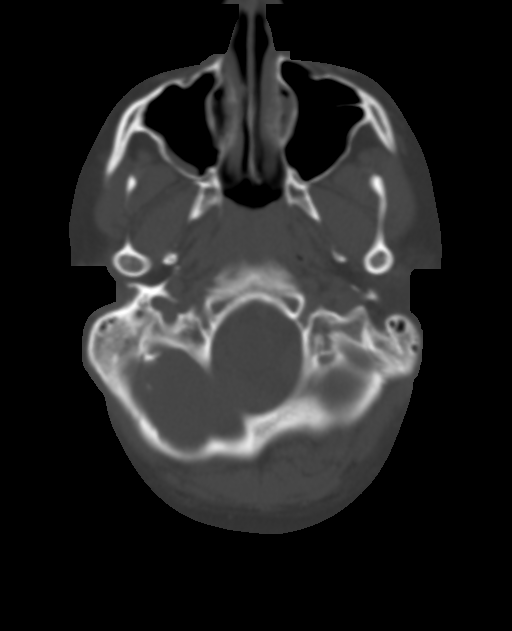
[im 13/55  brain]
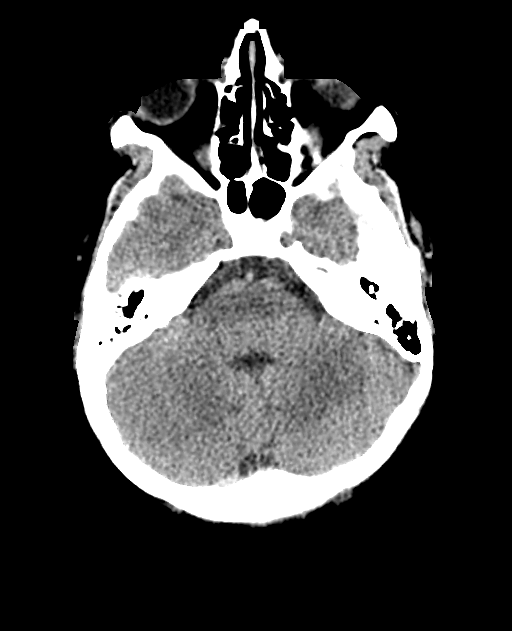
[im 19/55  brain]
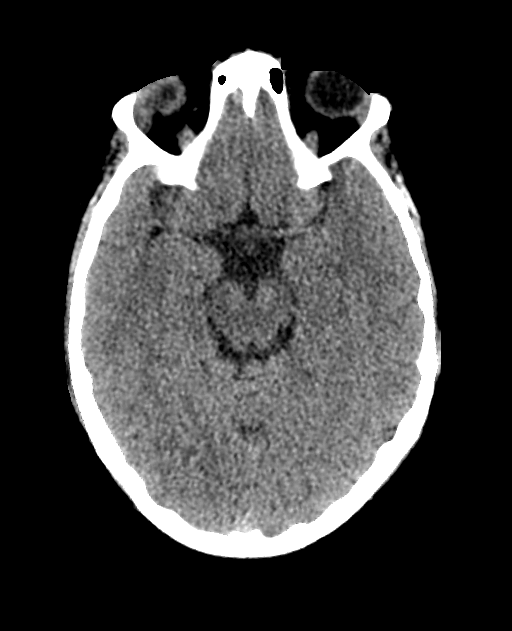
[im 25/55  brain]
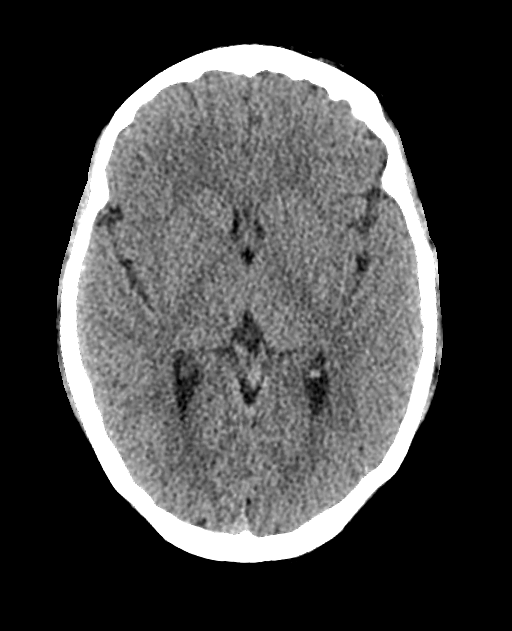
[im 31/55  brain]
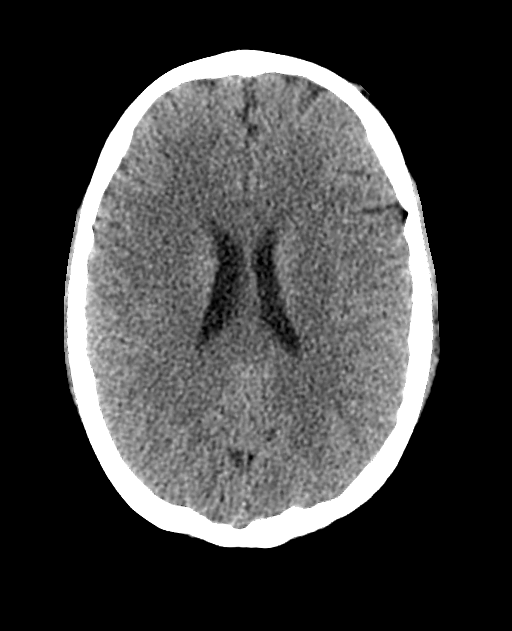
[im 31/55  bone]
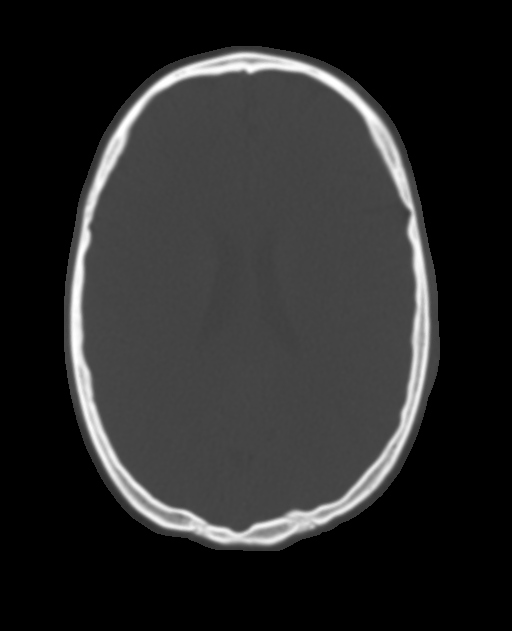
[im 37/55  brain]
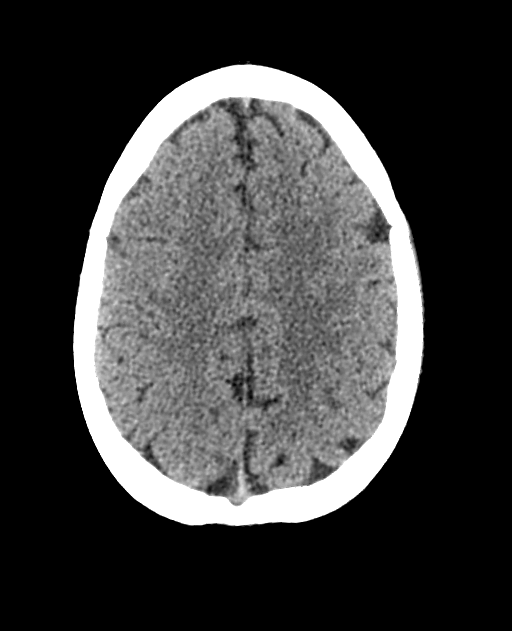
[im 43/55  brain]
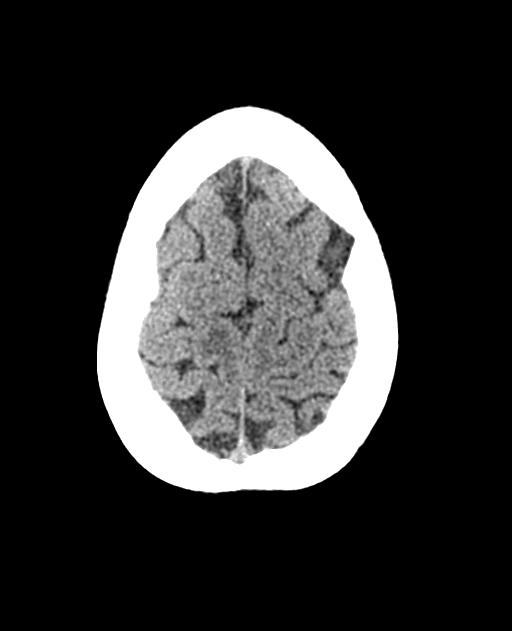
[im 49/55  brain]
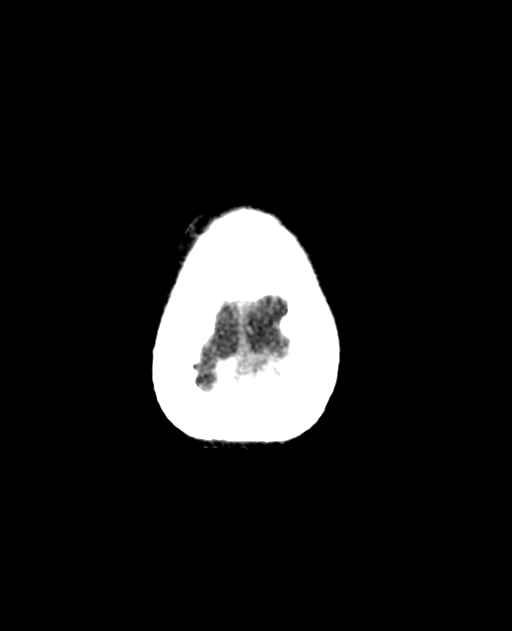

[14 of 47 positions shown; findings below may reference images not displayed]

FINDINGS: Brain: No evidence of acute infarction, hemorrhage, hydrocephalus,
extra-axial collection or mass lesion/mass effect.

Vascular: No hyperdense vessel or unexpected calcification.

Skull: Calvarium appears intact. No acute depressed skull fractures.

Sinuses/Orbits: Mucosal thickening in the paranasal sinuses. No
acute air-fluid levels. Mastoid air cells are clear.

Other: None.
IMPRESSION: No acute intracranial abnormalities.

## 2023-01-27 ENCOUNTER — Emergency Department (HOSPITAL_COMMUNITY)
Admission: EM | Admit: 2023-01-27 | Discharge: 2023-01-28 | Discharge status: Against Medical Advice | Payer: Medicare Other | Attending: Emergency Medicine | Admitting: Emergency Medicine

## 2023-01-27 ENCOUNTER — Other Ambulatory Visit: Payer: Self-pay

## 2023-01-27 ENCOUNTER — Encounter (HOSPITAL_COMMUNITY): Payer: Self-pay | Admitting: Emergency Medicine

## 2023-01-27 DIAGNOSIS — M545 Low back pain, unspecified: Secondary | ICD-10-CM | POA: Diagnosis present

## 2023-01-27 DIAGNOSIS — R1084 Generalized abdominal pain: Secondary | ICD-10-CM | POA: Diagnosis not present

## 2023-01-27 DIAGNOSIS — Z5321 Procedure and treatment not carried out due to patient leaving prior to being seen by health care provider: Secondary | ICD-10-CM | POA: Insufficient documentation

## 2023-01-27 LAB — CBC
HCT: 43.3 % (ref 36.0–46.0)
Hemoglobin: 14.5 g/dL (ref 12.0–15.0)
MCH: 30.5 pg (ref 26.0–34.0)
MCHC: 33.5 g/dL (ref 30.0–36.0)
MCV: 91 fL (ref 80.0–100.0)
Platelets: 298 10*3/uL (ref 150–400)
RBC: 4.76 MIL/uL (ref 3.87–5.11)
RDW: 12.9 % (ref 11.5–15.5)
WBC: 13.7 10*3/uL — ABNORMAL HIGH (ref 4.0–10.5)
nRBC: 0 % (ref 0.0–0.2)

## 2023-01-27 LAB — COMPREHENSIVE METABOLIC PANEL
ALT: 30 U/L (ref 0–44)
AST: 23 U/L (ref 15–41)
Albumin: 3.5 g/dL (ref 3.5–5.0)
Alkaline Phosphatase: 54 U/L (ref 38–126)
Anion gap: 9 (ref 5–15)
BUN: 6 mg/dL (ref 6–20)
CO2: 21 mmol/L — ABNORMAL LOW (ref 22–32)
Calcium: 8.7 mg/dL — ABNORMAL LOW (ref 8.9–10.3)
Chloride: 107 mmol/L (ref 98–111)
Creatinine, Ser: 0.57 mg/dL (ref 0.44–1.00)
GFR, Estimated: >60 mL/min (ref >60)
Glucose, Bld: 89 mg/dL (ref 70–99)
Potassium: 3.8 mmol/L (ref 3.5–5.1)
Sodium: 137 mmol/L (ref 135–145)
Total Bilirubin: 0.5 mg/dL (ref 0.0–1.2)
Total Protein: 6.6 g/dL (ref 6.5–8.1)

## 2023-01-27 MED ORDER — OXYCODONE HCL 5 MG PO TABS
5.0000 mg | ORAL_TABLET | Freq: Once | ORAL | Status: AC
Start: 2023-01-27 — End: 2023-01-27
  Administered 2023-01-27: 5 mg via ORAL
  Filled 2023-01-27: qty 1

## 2023-01-27 NOTE — ED Provider Triage Note (Signed)
 Emergency Medicine Provider Triage Evaluation Note  Ariel Hernandez , a 23 y.o. female  was evaluated in triage.  Pt complains of lumbar pain that started last night without injury or trauma and abdominal pain that started this morning.  Does describe radicular symptoms without urinary or bowel incontinence.  No emesis or diarrhea.  Review of Systems  Positive:  Negative:   Physical Exam  BP 124/80   Pulse 100   Temp 98.5 F (36.9 C) (Oral)   Resp 17   Ht 4' 11 (1.499 m)   Wt 77.1 kg   SpO2 100%   BMI 34.34 kg/m  Gen:   Awake, tearful during exam Resp:  Normal effort  MSK:   Moves extremities without difficulty, 5 out of 5 lower extremity strength, generalized lumbar tenderness, no CVAT Other:  Generalized abd tenderness  Medical Decision Making  Medically screening exam initiated at 4:05 PM.  Appropriate orders placed.  Ariel Hernandez was informed that the remainder of the evaluation will be completed by another provider, this initial triage assessment does not replace that evaluation, and the importance of remaining in the ED until their evaluation is complete.     Ariel Lynwood DEL, PA-C 01/27/23 [phone]

## 2023-01-27 NOTE — ED Triage Notes (Signed)
 Pt here for lower back pain since 10:30, denies injury. Pt states "I hurt my back and I don't know how". Reports pain with standing and standing up straight.

## 2023-01-27 NOTE — ED Notes (Signed)
 Called pt x3 for urine sample, no answer...KM

## 2023-02-04 ENCOUNTER — Ambulatory Visit (INDEPENDENT_AMBULATORY_CARE_PROVIDER_SITE_OTHER): Payer: Medicare Other | Admitting: Clinical

## 2023-02-04 DIAGNOSIS — F39 Unspecified mood [affective] disorder: Secondary | ICD-10-CM

## 2023-02-04 NOTE — Progress Notes (Signed)
THERAPIST PROGRESS NOTE Virtual Visit via Video Note  I connected with Ariel Hernandez on 02/04/2023 at  1:00 PM EST by a video enabled telemedicine application and verified that I am speaking with the correct person using two identifiers.  Location: Patient: home Provider: office   I discussed the limitations of evaluation and management by telemedicine and the availability of in person appointments. The patient expressed understanding and agreed to proceed.   Follow Up Instructions: I discussed the assessment and treatment plan with the patient. The patient was provided an opportunity to ask questions and all were answered. The patient agreed with the plan and demonstrated an understanding of the instructions.   The patient was advised to call back or seek an in-person evaluation if the symptoms worsen or if the condition fails to improve as anticipated.   Session Time: 40 minutes  Participation Level: Active  Behavioral Response: CasualAlertEuthymic  Type of Therapy: Individual Therapy  Treatment Goals addressed: Reduce overall depression score by a minimum of 25% on the Patient Health Questionnaire (PHQ-9) or the Montgomery-Asberg Depression Rating Scale (MADRS)   ProgressTowards Goals: Progressing  Interventions: CBT  Summary:  Ariel Hernandez is a 23 y.o. female who presents for the scheduled appointment oriented x 5, appropriately dressed, and friendly.  Client denied hallucinations and delusions. Client reported she is back in [place] after living with her brother in [place] for a few months.  Client reported she ultimately came back because of her health.  Client reported coming back if she was still looking for housing and ultimately came across living with a woman from her sisters church group.  Client reported she is happy she has a place but it poses as a challenge because the lady has pets that she is allergic to.  Client reported she will end up looking  for housing again at some point.  Client reported her and her sister's dynamic is still not that great.  Client reported her sister is a bit more rude to her than she has been years prior.  Client reported she is doing things independently.  [facility] reported [facility] been a [facility] due to negative interactions with some of the other participants.  Client reported she is also having difficulty with obtaining services back for employment amongst other things. Evidence of progress towards goal: Client reported 1 positive of utilizing assertive communication to call and get resources that she needs.  Suicidal/Homicidal: Nowithout intent/plan  Therapist Response:  Therapist began the appointment asking the client how she has been doing since last seen. Therapist used CBT to engage with active listening and positive emotional support. Therapist used CBT to engage the client and ask her about changes that have occurred since she was last in therapy with family and housing. Therapist used CBT to positively encouraged the client to utilize her independence and identify character traits that help her to be confident. Therapist used CBT ask the client to identify her progress with frequency of use with coping skills with continued practice in her daily activity.    Therapist saw the client homework to practice self-care.   Plan: Return again in 4 weeks.  Diagnosis: unspecified mood affective disorder  Collaboration of Care: Patient refused AEB none requested by the client.  Patient/Guardian was advised Release of Information must be obtained prior to any record release in order to collaborate their care with an outside provider. Patient/Guardian was advised if they have not already done so to  contact the registration department to sign all necessary forms in order for [place] to release information regarding their care.   Consent: Patient/Guardian gives verbal consent for treatment and  assignment of benefits for services provided during this visit. Patient/Guardian expressed understanding and agreed to proceed.   Neena Rhymes Helon Wisinski, LCSW 02/04/2023

## 2023-02-25 ENCOUNTER — Ambulatory Visit (HOSPITAL_COMMUNITY): Payer: Medicare Other | Admitting: Clinical

## 2023-02-26 ENCOUNTER — Ambulatory Visit (HOSPITAL_COMMUNITY): Payer: Medicare Other | Admitting: Clinical

## 2023-03-19 ENCOUNTER — Encounter (HOSPITAL_COMMUNITY): Payer: Self-pay | Admitting: Student

## 2023-03-19 ENCOUNTER — Telehealth (HOSPITAL_COMMUNITY): Payer: Self-pay

## 2023-03-19 ENCOUNTER — Ambulatory Visit (HOSPITAL_COMMUNITY): Payer: Medicare Other | Admitting: Clinical

## 2023-03-19 ENCOUNTER — Ambulatory Visit (HOSPITAL_COMMUNITY): Payer: Medicare Other | Admitting: Student

## 2023-03-19 VITALS — BP 136/84 | HR 99 | Ht 58.86 in | Wt 170.0 lb

## 2023-03-19 DIAGNOSIS — F605 Obsessive-compulsive personality disorder: Secondary | ICD-10-CM

## 2023-03-19 DIAGNOSIS — F4310 Post-traumatic stress disorder, unspecified: Secondary | ICD-10-CM | POA: Diagnosis not present

## 2023-03-19 DIAGNOSIS — Z8659 Personal history of other mental and behavioral disorders: Secondary | ICD-10-CM

## 2023-03-19 DIAGNOSIS — F411 Generalized anxiety disorder: Secondary | ICD-10-CM | POA: Diagnosis not present

## 2023-03-19 DIAGNOSIS — Z79899 Other long term (current) drug therapy: Secondary | ICD-10-CM

## 2023-03-19 DIAGNOSIS — F79 Unspecified intellectual disabilities: Secondary | ICD-10-CM | POA: Diagnosis not present

## 2023-03-19 DIAGNOSIS — F39 Unspecified mood [affective] disorder: Secondary | ICD-10-CM

## 2023-03-19 MED ORDER — FLUOXETINE HCL 10 MG PO CAPS: 10.0000 mg | ORAL_CAPSULE | Freq: Every morning | ORAL | 0 refills | Status: DC

## 2023-03-19 MED ORDER — QUETIAPINE FUMARATE 400 MG PO TABS: 400.0000 mg | ORAL_TABLET | Freq: Every day | ORAL | 2 refills | Status: DC

## 2023-03-19 MED ORDER — LAMOTRIGINE 200 MG PO TABS: 200.0000 mg | ORAL_TABLET | Freq: Every day | ORAL | 2 refills | Status: DC

## 2023-03-19 MED ORDER — BUSPIRONE HCL 30 MG PO TABS: 30.0000 mg | ORAL_TABLET | Freq: Two times a day (BID) | ORAL | 2 refills | Status: DC

## 2023-03-19 NOTE — Telephone Encounter (Addendum)
 Hello,    Pt is requesting for a refill of BUSPIRONE HCL 15 MG tablets to be sent to pharmacy.   Pt is also requesting for QUEtiapine (SEROQUEL) 400 MG tablets for pharmacy.
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
## 2023-03-23 ENCOUNTER — Ambulatory Visit (INDEPENDENT_AMBULATORY_CARE_PROVIDER_SITE_OTHER): Payer: Medicare Other | Admitting: Clinical

## 2023-03-23 DIAGNOSIS — Z8659 Personal history of other mental and behavioral disorders: Secondary | ICD-10-CM | POA: Insufficient documentation

## 2023-03-23 DIAGNOSIS — F411 Generalized anxiety disorder: Secondary | ICD-10-CM

## 2023-03-23 DIAGNOSIS — Z79899 Other long term (current) drug therapy: Secondary | ICD-10-CM | POA: Insufficient documentation

## 2023-03-23 NOTE — Progress Notes (Signed)
 THERAPIST PROGRESS NOTE Virtual Visit via Video Note  I connected with Ariel Hernandez on 03/23/23 at  3:00 PM EST by a video enabled telemedicine application and verified that I am speaking with the correct person using two identifiers.  Location: Patient: home Provider: office   I discussed the limitations of evaluation and management by telemedicine and the availability of in person appointments. The patient expressed understanding and agreed to proceed.   Follow Up Instructions: I discussed the assessment and treatment plan with the patient. The patient was provided an opportunity to ask questions and all were answered. The patient agreed with the plan and demonstrated an understanding of the instructions.   The patient was advised to call back or seek an in-person evaluation if the symptoms worsen or if the condition fails to improve as anticipated.   Session Time: 40 minutes  Participation Level: Active  Behavioral Response: CasualAlertEuthymic  Type of Therapy: Individual Therapy  Treatment Goals addressed: Reduce overall depression score by a minimum of 25% on the Patient Health Questionnaire (PHQ-9) or the Montgomery-Asberg Depression Rating Scale (MADRS)   ProgressTowards Goals: Progressing  Interventions: CBT and Supportive  Summary:  Ariel Hernandez is a 23 y.o. female who presents for the scheduled appointment oriented times five, appropriately dressed and friendly. Client denied hallucinations and delusions. Client reported on today she has been feeling some stress.  Client reported things have not been going well at [facility].  Client reported over a month ago one of her friends accused her of lying and saying something negatively about their family which she did not say.  Client reported ever since then others in the program have been making negative comments towards her which is caused her to isolate herself when she  does go to the program.  Client reported she is also under the impression that the staff working there is also tired of her going to them about the situation and have told her to handle it like an adult.  Client reported she has tried talking to them and initiating a process of resolving the matter but she was told that she was just trying to get sympathy from other people.  Client reported she talked to her dad about the situation and her sister.  Client reported her sister was upset that she does not want to go to the program and has been looking for another place to go.  Client reported she does not want to come off as immature for wanting to go to another program but she feels that the space is now counterproductive to her mental health. Evidence of progress towards goal: Client reported needing help with 1 issue of attempting conflict resolution.  Suicidal/Homicidal: Nowithout intent/plan  Therapist Response:  Therapist began the appointment asking the client how she has been doing since last seen. Therapist used CBT to engage with active listening and positive emotional support. Therapist used CBT to engage with the client and ask her how she has been managing/coping with the conflict at her day program. Therapist used CBT to have the client identify how she has tried to resolve the issue and teach her about using assertive communication. Therapist used CBT ask the client to identify her progress with frequency of use with coping skills with continued practice in her daily activity.    Therapist assigned the client homework to think about attempting communicating with her peers at the day support program as discussed during her  therapy session.   Plan: Return again in 4 weeks.  Diagnosis: GAD  Collaboration of Care: Patient refused AEB none requested by the client.  Patient/Guardian was advised Release of Information must be obtained prior to any record release in order to collaborate their  care with an outside provider. Patient/Guardian was advised if they have not already done so to contact the registration department to sign all necessary forms in order for [place] to release information regarding their care.   Consent: Patient/Guardian gives verbal consent for treatment and assignment of benefits for services provided during this visit. Patient/Guardian expressed understanding and agreed to proceed.   Neena Rhymes Ariel Strollo, LCSW 03/23/2023

## 2023-03-24 ENCOUNTER — Ambulatory Visit (HOSPITAL_COMMUNITY): Payer: Medicare Other | Admitting: Clinical

## 2023-04-10 ENCOUNTER — Ambulatory Visit (HOSPITAL_COMMUNITY)
Admission: EM | Admit: 2023-04-10 | Discharge: 2023-04-10 | Disposition: A | Discharge status: Home / Self Care | Attending: Internal Medicine | Admitting: Internal Medicine

## 2023-04-10 ENCOUNTER — Ambulatory Visit (HOSPITAL_COMMUNITY)

## 2023-04-10 ENCOUNTER — Encounter (HOSPITAL_COMMUNITY): Payer: Self-pay | Admitting: *Deleted

## 2023-04-10 DIAGNOSIS — L2381 Allergic contact dermatitis due to animal (cat) (dog) dander: Secondary | ICD-10-CM | POA: Diagnosis not present

## 2023-04-10 DIAGNOSIS — L509 Urticaria, unspecified: Secondary | ICD-10-CM

## 2023-04-10 DIAGNOSIS — R051 Acute cough: Secondary | ICD-10-CM

## 2023-04-10 DIAGNOSIS — J208 Acute bronchitis due to other specified organisms: Secondary | ICD-10-CM

## 2023-04-10 MED ORDER — METHYLPREDNISOLONE ACETATE 40 MG/ML IJ SUSP
20.0000 mg | Freq: Once | INTRAMUSCULAR | Status: AC
  Administered 2023-04-10: 20 mg via INTRAMUSCULAR

## 2023-04-10 MED ORDER — PREDNISONE 20 MG PO TABS: 40.0000 mg | ORAL_TABLET | Freq: Every day | ORAL | 0 refills | Status: AC

## 2023-04-10 MED ORDER — CLOBETASOL PROPIONATE 0.05 % EX CREA
1.0000 | TOPICAL_CREAM | Freq: Two times a day (BID) | CUTANEOUS | 1 refills | Status: DC
Start: 2023-04-10 — End: 2023-06-18

## 2023-04-10 MED ORDER — METHYLPREDNISOLONE ACETATE 40 MG/ML IJ SUSP
INTRAMUSCULAR | Status: AC
  Filled 2023-04-10: qty 1

## 2023-04-10 NOTE — Discharge Instructions (Addendum)
 Symptoms are consistent with acute bronchitis and allergic contact dermatitis due to animal dander also causing eczema flare. This normally needs steroids to help with symptom relief and long term will need to see an allergy specialist and likely dermatologist.  We will treat with the following: Medrol injection given today. This is a steroid to help with inflammation and pain.  Prednisone 40 mg (2 tablets) once daily for 5 days. Take this in the morning.  This is a steroid to help with inflammation and pain.  Start this medication tomorrow, 3/22.  Clobetasol cream twice daily to the areas that are itching on the arms, trunk and legs.  May use this for 14 days on the neck and face but do not apply near the eye.  Do not use this on the neck or face for more than 14 days May continue triamcinolone cream twice daily as needed. Continue albuterol inhaler as needed for shortness of breath Continue Singulair and Claritin.  May need to take Claritin twice daily to help with symptom relief. Be sure to wash your hands before touching your face if you are touching anything that the dog has been on. Make an appointment to see dermatology and an allergist as soon as possible Return to urgent care or PCP if symptoms worsen or fail to resolve.

## 2023-04-10 NOTE — ED Provider Notes (Signed)
 [facility]    CSN: [id number] Arrival date & time: 04/10/23  1808      History   Chief Complaint Chief Complaint  Patient presents with   Allergic Reaction    HPI Ariel Hernandez is a 23 y.o. female.   23 year old female who presents to urgent care with complaints of allergic reaction to dogs as well as cough and shortness of breath.  She reports that she is living with someone who has a dog and that this is caused her to have an outbreak of her eczema.  She is having diffuse itching especially around her neck and trunk.  She has tried triamcinolone cream, Vaseline, Aquaphor, Eucerin and Neosporin but it is not helping.  She is also having cough with shortness of breath that has started within the last week.  She did mention this to her primary care doctor when she saw her and they have placed a referral for an allergist but it was going to be several months before she could get to see them therefore she came to urgent care for further evaluation.  She denies any fevers or chills, nausea or vomiting. She is eating and drinking well.  She does take allergy modifying medication daily.     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   Allergy    Seasonal and animals   Anxiety    Asthma    Depression    [employer] 05/17/2020   Intermittent explosive disorder 04/27/2020   Oppositional defiant behavior    Pneumonia    PTSD (post-traumatic stress disorder)    Urinary tract infection    hx;of only once   Vision abnormalities    Hx: wears glasses    Patient Active Problem List   Diagnosis Date Noted   Long term current use of antipsychotic medication 03/23/2023   History of admission to inpatient psychiatry department 03/23/2023   Adopted 09/04/2022   AR (allergic rhinitis) 02/18/2022    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   Atopic dermatitis 02/18/2022   Dysmenorrhea 11/03/2021   PTSD (post-traumatic stress disorder) 08/08/2021   Unspecified mood (affective) disorder (HCC) 08/08/2021   Intellectual disability 04/27/2020   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   Menorrhagia with irregular cycle 02/09/2019   Mild persistent asthma without complication 02/09/2019   Seasonal allergic rhinitis due to pollen 02/09/2019   Intrinsic (allergic) eczema 02/09/2019   Generalized anxiety disorder 02/09/2019   Other constipation 07/22/2017   ADHD (attention deficit hyperactivity disorder) 06/30/2011    Past Surgical History:  Procedure Laterality Date   ADENOIDECTOMY     TOOTH EXTRACTION N/A 10/11/2012   Procedure: EXTRACTIONS  TEETH 5, 12, 21, 28 ;  Surgeon: [name], DDS;  Location: [facility];  Service: Oral Surgery;  Laterality: N/A;    OB History     Gravida  0   Para  0   Term  0   Preterm  0   AB  0   Living  0      SAB  0   IAB  0   Ectopic  0   Multiple  0   Live Births  0            Home Medications    Prior to Admission medications   Medication Sig Start Date End Date Taking? Authorizing  Provider  albuterol (PROAIR HFA) 108 (90 Base) MCG/ACT inhaler Inhale 2 puffs into the lungs every 4 (four) hours as needed (cough). 05/17/20  Yes Vella Kohler, MD  albuterol (PROVENTIL) (2.5 MG/3ML) 0.083% nebulizer solution Take 3 mLs (2.5 mg total) by nebulization every 4 (four) hours as needed for wheezing or shortness of breath. 06/04/20  Yes Vella Kohler, MD  ALTAVERA 0.15-30 MG-MCG tablet Take 1 tablet by mouth daily.   Yes [provider]  budesonide-formoterol (SYMBICORT) 160-4.5 MCG/ACT inhaler Inhale into the lungs. Inhale two puffs into the lungs 2 (two) times daily. 08/07/22  Yes [provider]  busPIRone (BUSPAR) 30 MG tablet Take 1 tablet (30 mg total) by mouth 2 (two) times daily. 03/19/23 06/17/23 Yes  Princess Bruins, DO  FLUoxetine (PROZAC) 10 MG capsule Take 1 capsule (10 mg total) by mouth every morning. 03/19/23 05/18/23 Yes Princess Bruins, DO  fluticasone [facility]) 50 MCG/ACT nasal spray Place 1 spray into both nostrils daily. 05/17/20  Yes Vella Kohler, MD  lamoTRIgine (LAMICTAL) 200 MG tablet Take 1 tablet (200 mg total) by mouth at bedtime. 03/19/23 06/17/23 Yes Princess Bruins, DO  loratadine (CLARITIN) 10 MG tablet Take 1 tablet (10 mg total) by mouth daily. 05/17/20  Yes Vella Kohler, MD  montelukast (SINGULAIR) 10 MG tablet Take 1 tablet (10 mg total) by mouth at bedtime. 05/17/20  Yes Vella Kohler, MD  QUEtiapine (SEROQUEL) 400 MG tablet Take 1 tablet (400 mg total) by mouth at bedtime. 03/19/23 06/17/23 Yes Princess Bruins, DO  triamcinolone cream (KENALOG) 0.1 % APPLY TO AFFECTED AREA TWICE A DAY 03/10/21  Yes Theadora Rama Scales, PA-C  clotrimazole-betamethasone (LOTRISONE) cream Apply to affected area 2 times daily  for 7 days 03/03/22   Rodriguez-Southworth, Nettie Elm, PA-C  erythromycin ophthalmic ointment Place 1 Application into the right eye at bedtime. Place one Application into the right eye at bedtime. 08/07/22   [provider]  fluticasone (FLOVENT HFA) 110 MCG/ACT inhaler Inhale 2 puffs into the lungs 2 (two) times daily. USE WITH SPACER. 05/17/20   Vella Kohler, MD  hydrOXYzine (ATARAX) 25 MG tablet Take 1 tablet (25 mg total) by mouth daily as needed for anxiety. 10/16/22   Princess Bruins, DO  levonorgestrel-ethinyl estradiol (ALESSE) 0.1-20 MG-MCG tablet Take 1 tablet by mouth daily. Take 21 active pills then discard pack and start next pack. Do not take placebo pills. 11/06/21   Leftwich-Kirby, Wilmer Floor, CNM  MIRALAX 17 GM/SCOOP powder Take 17 g by mouth daily. 02/01/19   [provider]  pseudoephedrine (SUDAFED) 60 MG tablet Take 1 tablet (60 mg total) by mouth every 8 (eight) hours as needed for congestion. 12/06/20   Wallis Bamberg, PA-C    Family  History Family History  Adopted: Yes    Social History Social History   Tobacco Use   Smoking status: Never   Smokeless tobacco: Never  Vaping Use   Vaping status: Never Used  Substance Use Topics   Alcohol use: No   Drug use: No     Allergies   Dexmethylphenidate hcl, Fish-derived products, Grass pollen([id number]-swt vern), Other, Hydrocortisone, Fish allergy, Focalin [dexmethylphenidate], Bee pollen, and Milk (cow)   Review of Systems Review of Systems  Constitutional:  Negative for chills and fever.  HENT:  Positive for congestion. Negative for ear pain and sore throat.   Eyes:  Negative for pain and visual disturbance.  Respiratory:  Positive for cough and shortness of breath.   Cardiovascular:  Negative for chest pain and palpitations.  Gastrointestinal:  Negative for abdominal pain and vomiting.  Genitourinary:  Negative for dysuria and hematuria.  Musculoskeletal:  Negative for arthralgias and back pain.  Skin:  Positive for color change, rash and wound.  Neurological:  Negative for seizures and syncope.  All other systems reviewed and are negative.    Physical Exam Triage Vital Signs ED Triage Vitals  Encounter Vitals Group     BP 04/10/23 1822 (!) 149/83     Systolic BP Percentile --      Diastolic BP Percentile --      Pulse Rate 04/10/23 1822 (!) 101     Resp 04/10/23 1822 20     Temp 04/10/23 1822 98.3 F (36.8 C)     Temp Source 04/10/23 1822 Oral     SpO2 04/10/23 1822 96 %     Weight --      Height --      Head Circumference --      Peak Flow --      Pain Score 04/10/23 1819 0     Pain Loc --      Pain Education --      Exclude from Growth Chart --    No data found.  Updated Vital Signs BP (!) 149/83 (BP Location: Right Arm)   Pulse (!) 101   Temp 98.3 F (36.8 C) (Oral)   Resp 20   LMP 03/23/2023 (Approximate)   SpO2 96%   Visual Acuity Right Eye Distance:   Left Eye Distance:   Bilateral Distance:    Right Eye Near:   Left  Eye Near:    Bilateral Near:     Physical Exam Vitals and nursing note reviewed.  Constitutional:      General: She is not in acute distress.    Appearance: She is well-developed.  HENT:     Head: Normocephalic and atraumatic.  Eyes:     Conjunctiva/sclera: Conjunctivae normal.  Cardiovascular:     Rate and Rhythm: Normal rate and regular rhythm.     Heart sounds: No murmur heard. Pulmonary:     Effort: Pulmonary effort is normal. No respiratory distress.     Breath sounds: Examination of the right-middle field reveals wheezing. Examination of the left-middle field reveals wheezing. Wheezing present. No decreased breath sounds or rhonchi.  Abdominal:     Palpations: Abdomen is soft.     Tenderness: There is no abdominal tenderness.  Musculoskeletal:        General: No swelling.     Cervical back: Neck supple.  Skin:    General: Skin is warm and dry.     Capillary Refill: Capillary refill takes less than 2 seconds.     Findings: Rash present. Rash is crusting and urticarial.     Comments: Right neck behind the ear, circumferential neck, trunk, hands with erythematous rash present.  Some areas are scabbed from scratching.  Neurological:     Mental Status: She is alert.  Psychiatric:        Mood and Affect: Mood normal.      UC Treatments / Results  Labs (all labs ordered are listed, but only abnormal results are displayed) Labs Reviewed - No data to display  EKG   Radiology No results found.  Procedures Procedures (including critical care time)  Medications Ordered in UC Medications - No data to display  Initial Impression / Assessment and Plan / UC Course  I have reviewed the triage vital  signs and the nursing notes.  Pertinent labs & imaging results that were available during my care of the patient were reviewed by me and considered in my medical decision making (see chart for details).     Acute cough - Plan: DG Chest 2 View, DG Chest 2  View  Urticaria  Allergic contact dermatitis due to animal dander  Acute bronchitis due to other specified organisms   Symptoms are consistent with acute bronchitis and allergic contact dermatitis due to animal dander also causing eczema flare. This normally needs steroids to help with symptom relief and long term will need to see an allergy specialist and likely dermatologist.  We will treat with the following: Medrol injection given today. This is a steroid to help with inflammation and pain.  Prednisone 40 mg (2 tablets) once daily for 5 days. Take this in the morning.  This is a steroid to help with inflammation and pain.  Start this medication tomorrow, 3/22.  Clobetasol cream twice daily to the areas that are itching on the arms, trunk and legs.  May use this for 14 days on the neck and face but do not apply near the eye.  Do not use this on the neck or face for more than 14 days May continue triamcinolone cream twice daily as needed. Continue albuterol inhaler as needed for shortness of breath Continue Singulair and Claritin.  May need to take Claritin twice daily to help with symptom relief. Be sure to wash your hands before touching your face if you are touching anything that the dog has been on. Make an appointment to see dermatology and an allergist as soon as possible Return to urgent care or PCP if symptoms worsen or fail to resolve.    Final Clinical Impressions(s) / UC Diagnoses   Final diagnoses:  None   Discharge Instructions   None    ED Prescriptions   None    PDMP not reviewed this encounter.   Quintella Reichert 04/10/23 1935

## 2023-04-10 NOTE — ED Triage Notes (Signed)
 Pt states she is allergic to dog and she is having a eczema flare, cough, SOB X 2 weeks. She states she lives with a roommate who has a dog. She doesn't see allergy and asthma yet she just asked for a referral from her PCP.

## 2023-04-23 ENCOUNTER — Ambulatory Visit (INDEPENDENT_AMBULATORY_CARE_PROVIDER_SITE_OTHER): Payer: Medicare Other | Admitting: Student

## 2023-04-23 ENCOUNTER — Encounter (HOSPITAL_COMMUNITY): Payer: Self-pay | Admitting: Student

## 2023-04-23 DIAGNOSIS — F4310 Post-traumatic stress disorder, unspecified: Secondary | ICD-10-CM

## 2023-04-23 DIAGNOSIS — Z79899 Other long term (current) drug therapy: Secondary | ICD-10-CM

## 2023-04-23 DIAGNOSIS — Z8659 Personal history of other mental and behavioral disorders: Secondary | ICD-10-CM

## 2023-04-23 DIAGNOSIS — F79 Unspecified intellectual disabilities: Secondary | ICD-10-CM

## 2023-04-23 DIAGNOSIS — F39 Unspecified mood [affective] disorder: Secondary | ICD-10-CM | POA: Diagnosis not present

## 2023-04-23 DIAGNOSIS — F411 Generalized anxiety disorder: Secondary | ICD-10-CM | POA: Diagnosis not present

## 2023-04-23 DIAGNOSIS — F605 Obsessive-compulsive personality disorder: Secondary | ICD-10-CM

## 2023-04-23 MED ORDER — QUETIAPINE FUMARATE 300 MG PO TABS: 300.0000 mg | ORAL_TABLET | Freq: Every day | ORAL | 1 refills | Status: DC

## 2023-04-23 MED ORDER — LAMOTRIGINE 200 MG PO TABS: 200.0000 mg | ORAL_TABLET | Freq: Every day | ORAL | 2 refills | Status: DC

## 2023-04-23 MED ORDER — FLUOXETINE HCL 20 MG PO CAPS: 20.0000 mg | ORAL_CAPSULE | Freq: Every morning | ORAL | 0 refills | Status: DC

## 2023-04-23 MED ORDER — BUSPIRONE HCL 30 MG PO TABS: 30.0000 mg | ORAL_TABLET | Freq: Two times a day (BID) | ORAL | 2 refills | Status: DC
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
## 2023-04-24 ENCOUNTER — Encounter: Payer: Self-pay | Admitting: Allergy

## 2023-04-24 ENCOUNTER — Other Ambulatory Visit: Payer: Self-pay

## 2023-04-24 ENCOUNTER — Ambulatory Visit (INDEPENDENT_AMBULATORY_CARE_PROVIDER_SITE_OTHER): Admitting: Allergy

## 2023-04-24 VITALS — BP 120/58 | HR 101 | Temp 98.4°F | Resp 16 | Ht <= 58 in | Wt 168.7 lb

## 2023-04-24 DIAGNOSIS — J4540 Moderate persistent asthma, uncomplicated: Secondary | ICD-10-CM | POA: Diagnosis not present

## 2023-04-24 DIAGNOSIS — J310 Chronic rhinitis: Secondary | ICD-10-CM | POA: Diagnosis not present

## 2023-04-24 DIAGNOSIS — L2089 Other atopic dermatitis: Secondary | ICD-10-CM | POA: Diagnosis not present

## 2023-04-24 DIAGNOSIS — T7800XD Anaphylactic reaction due to unspecified food, subsequent encounter: Secondary | ICD-10-CM

## 2023-04-24 DIAGNOSIS — H1013 Acute atopic conjunctivitis, bilateral: Secondary | ICD-10-CM

## 2023-04-24 DIAGNOSIS — H109 Unspecified conjunctivitis: Secondary | ICD-10-CM

## 2023-04-24 MED ORDER — LEVOCETIRIZINE DIHYDROCHLORIDE 5 MG PO TABS: 5.0000 mg | ORAL_TABLET | Freq: Every evening | ORAL | 2 refills | Status: DC

## 2023-04-24 MED ORDER — OLOPATADINE HCL 0.2 % OP SOLN: 1.0000 [drp] | Freq: Every day | OPHTHALMIC | 2 refills | Status: DC

## 2023-04-24 MED ORDER — EPINEPHRINE 0.3 MG/0.3ML IJ SOAJ: 0.3000 mg | INTRAMUSCULAR | 1 refills | Status: AC | PRN

## 2023-04-24 MED ORDER — FLUTICASONE PROPIONATE 50 MCG/ACT NA SUSP: 2.0000 | Freq: Every day | NASAL | 2 refills | Status: DC

## 2023-04-24 NOTE — Progress Notes (Signed)
 New Patient Note  RE: Ariel Hernandez MRN: [id number] DOB: 2000/03/09 Date of Office Visit: 04/24/2023  Primary care provider: Nathaneil Canary, PA-C  Chief Complaint: Allergies  History of present illness: Ariel Hernandez is a 23 y.o. female presenting today for evaluation of asthma. Discussed the use of AI scribe software for clinical note transcription with the patient, who gave verbal consent to proceed.  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 This has led to increased asthma symptoms and eczema flare-ups. She has been staying in her room to avoid the dog and has been cautious about washing her hands. Recently, she experienced bronchitis and severe eczema due to the allergic reaction.  Her asthma, present since childhood, is triggered by hot and cold weather, exercise, and allergens such as dogs. Symptoms include chest pain, difficulty breathing, nausea, and lightheadedness during flare-ups. She uses an albuterol inhaler as needed and has recently started using Symbicort, two puffs twice a day, which has improved her breathing when not exposed to triggers. She also takes Singulair regularly.  Her eczema is exacerbated by the dog allergy, causing painful flare-ups on her chest, neck, ears, and face. She uses triamcinolone cream and clobetasol for her eczema, but these have not been very effective she was advised not to use these on her face and neck which are the more problematic areas. She also uses Vaseline and Aveeno lotion for her skin. Her eczema worsens in the shower, causing burning sensations.  She has a history of allergies to shrimp and fish, which cause severe reactions including itching, throat discomfort, and nausea. She avoids these foods and does not have an EpiPen. She also reports a reaction to coleslaw, causing throat itchiness and a 'fuzzy' feeling on her tongue. She can consume dairy products like  cheese and yogurt but cannot drink regular milk without feeling nauseous.  She also says she can eat leafy vegetables she just cannot eat coleslaw.  She uses Claritin and Benadryl for her allergies.. She also uses Flonase, two sprays in each nostril twice a day, especially during pollen season. She does not currently use eye drops for her allergy symptoms.     She states she has been working on becoming more independent and mature especially when involves her healthcare but states her sister does help her make all decisions and she wants her to weigh in on the changes that were going to make today.  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   Allergy    Seasonal and animals   Anxiety    Asthma    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   Depression    Phreesia 05/17/2020   Eczema    Intermittent explosive disorder 04/27/2020   Oppositional defiant behavior    Pneumonia    PTSD (post-traumatic stress disorder)    Urinary tract infection    hx;of only once   Vision abnormalities    Hx: wears glasses    Past surgical history: Past Surgical History:  Procedure Laterality Date   ADENOIDECTOMY     TOOTH EXTRACTION N/A 10/11/2012   Procedure: EXTRACTIONS  TEETH 5, 12, 21, 28 ;  Surgeon: [name], DDS;  Location: [facility] OR;  Service: Oral Surgery;  Laterality: N/A;   TYMPANOSTOMY TUBE PLACEMENT      Family history:  Family History  Adopted: Yes    Social history: Lives in a home without carpeting with electric heating and central cooling.  Dogs in the home.  There is no concern for water damage, mildew or roaches in the home.  She denies a smoking history.   Medication List: Current Outpatient Medications  Medication Sig Dispense Refill   albuterol (PROAIR HFA) 108 (90  Base) MCG/ACT inhaler Inhale 2 puffs into the lungs every 4 (four) hours as needed (cough). 36 g 5   albuterol (PROVENTIL) (2.5 MG/3ML) 0.083% nebulizer solution Take 3 mLs (2.5 mg total) by nebulization every 4 (four) hours as needed for wheezing or shortness of breath. 75 mL 2   ALTAVERA 0.15-30 MG-MCG tablet Take 1 tablet by mouth daily.     budesonide-formoterol (SYMBICORT) 160-4.5 MCG/ACT inhaler Inhale into the lungs. Inhale two puffs into the lungs 2 (two) times daily.     busPIRone (BUSPAR) 30 MG tablet Take 1 tablet (30 mg total) by mouth 2 (two) times daily. 60 tablet 2   FLUoxetine (PROZAC) 20 MG capsule Take 1 capsule (20 mg total) by mouth every morning. 90 capsule 0   fluticasone (FLONASE) 50 MCG/ACT nasal spray Place 1 spray into both nostrils daily. 16 mL 11   lamoTRIgine (LAMICTAL) 200 MG tablet Take 1 tablet (200 mg total) by mouth at bedtime. 30 tablet 2   loratadine (CLARITIN) 10 MG tablet Take 1 tablet (10 mg total) by mouth daily. 30 tablet 11   MIRALAX 17 GM/SCOOP powder Take 17 g by mouth daily.     montelukast (SINGULAIR) 10 MG tablet Take 1 tablet (10 mg total) by mouth at bedtime. 30 tablet 11   QUEtiapine (SEROQUEL) 300 MG tablet Take 1 tablet (300 mg total) by mouth at bedtime. 30 tablet 1   triamcinolone cream (KENALOG) 0.1 % APPLY TO AFFECTED AREA TWICE A DAY 453.6 g 0   clobetasol cream (TEMOVATE) 0.05 % Apply 1 Application topically 2 (two) times daily. (Patient not taking: Reported on 04/24/2023) 60 g 1   No current facility-administered medications for this visit.    Known medication allergies: Allergies  Allergen Reactions   Dexmethylphenidate Hcl Shortness Of Breath   Fish-Derived Products Shortness Of Breath   Grass Pollen(K-O-R-T-Swt Vern) Itching   Other Diarrhea, Nausea And Vomiting and Itching    Fish, milk   Hydrocortisone Rash   Fish Allergy    Focalin [Dexmethylphenidate]    Bee Pollen Itching and Rash   Milk (Cow) Diarrhea, Nausea And Vomiting  and Palpitations     Physical examination: Blood pressure (!) 120/58, pulse (!) 101, temperature 98.4 F (36.9 C), temperature source Temporal, resp. rate 16, height 4' 8.69" (1.44 m), weight 168 lb 11.2 oz (76.5 kg), last menstrual period 03/23/2023, SpO2 97%.  General: Alert, interactive, in no acute distress. HEENT: PERRLA, TMs pearly gray, turbinates mildly edematous without discharge, post-pharynx non erythematous. Neck: Supple without lymphadenopathy. Lungs: Clear to auscultation without wheezing, rhonchi or rales. {no increased work of breathing. CV: Normal S1, S2 without murmurs. Abdomen: Nondistended, nontender. Skin: Dry, erythematous, excoriated patches on the lateral edges of the nape of the neck, bilateral upper eyelids, forehead around the hairline . Extremities:  No clubbing, cyanosis or edema. Neuro:   Grossly intact.  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 Allergy immunotherapy may be beneficial but requires asthma control first. - Switch from Claritin to Xyzal 5mg  daily at this time. - Continue Flonase 2 sprays each nostril daily for 1-2 weeks at a time before stopping once nasal congestion improves for maximum benefit - Can use Olopatadine 0.2% 1 drop each eye daily as needed - Consider allergy immunotherapy once asthma is controlled.  Asthma Asthma remains not well controlled. Dupixent is considered to improve control and is administered biweekly via autoinjector. Dupixent can be administered at home or in office.  Blood work will assess eosinophil levels to determine if asthma is eosinophilic type which responds well to Dupixent. - Continue Symbicort 160mg  2 puffs twice a day with spacer device - Continue Singulair 10mg   daily. - Have access to albuterol inhaler 2 puffs every 4-6 hours as needed for cough/wheeze/shortness of breath/chest tightness.  May use 15-20 minutes prior to activity.   Monitor frequency of use.   - Discussed Dupixent as a potential treatment option. - Order blood work to assess eosinophil levels.  Asthma control goals:  Full participation in all desired activities (may need albuterol before activity) Albuterol use two time or less a week on average (not counting use with activity) Cough interfering with sleep two time or less a month Oral steroids no more than once a year No hospitalizations  Eczema Eczema flares exacerbated by dog allergy, affecting neck, eyelids, forehead, and ears. Dupixent is considered to improve eczema control by reducing the allergy cascade. - Prescribe a nonsteroid ointment, Protopic ointment, use twice a day as needed for eczema flares.  This can be used anywhere on body.   - Continue your Triamcinolone ointment (for milder flares on body) and Clobetasol ointment (for severe flares on body) - Daily moisturization after bathing with a scent-free and dye-free moisturizer - Discuss Dupixent as a potential treatment option.  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 Reaction to coleslaw causing throat itchiness and discomfort. Milk causes nausea and stomach pain, but not lactose intolerant and tolerates cheese, yogurts, ice cream.  - Order blood work to assess specific food allergies - fish and shellfish - Continue avoiding fish, shellfish and coleslaw in diet - Follow emergency action plan in case of allergic reaction and have access to Epipen device 0.3mg    Follow-up She needs to discuss treatment options with her sister and return for lab work. Lab work will help determine the appropriateness of Dupixent and assess allergy profile. Routine visit in 3-4 months or sooner if needed  I appreciate the  opportunity to take part in Fergie's care. Please do not hesitate to contact me with questions.  Sincerely,   Margo Aye, MD Allergy/Immunology [facility]

## 2023-04-24 NOTE — Patient Instructions (Addendum)
 Allergic rhinitis Severe allergy to dogs causing rhinorrhea, nasal congestion, sneezing, conjunctivitis, and asthma exacerbation. Allergy immunotherapy may be beneficial but requires asthma control first. - Switch from Claritin to Xyzal 5mg  daily at this time. - Continue Flonase 2 sprays each nostril daily for 1-2 weeks at a time before stopping once nasal congestion improves for maximum benefit - Can use Olopatadine 0.2% 1 drop each eye daily as needed - Consider allergy immunotherapy once asthma is controlled.  Asthma Asthma remains not well controlled. Dupixent is considered to improve control and is administered biweekly via autoinjector. Dupixent can be administered at home or in office.  Blood work will assess eosinophil levels to determine if asthma is eosinophilic type which responds well to Dupixent. - Continue Symbicort 160mg  2 puffs twice a day with spacer device - Continue Singulair 10mg  daily. - Have access to albuterol inhaler 2 puffs every 4-6 hours as needed for cough/wheeze/shortness of breath/chest tightness.  May use 15-20 minutes prior to activity.   Monitor frequency of use.   - Discussed Dupixent as a potential treatment option. - Order blood work to assess eosinophil levels.  Asthma control goals:  Full participation in all desired activities (may need albuterol before activity) Albuterol use two time or less a week on average (not counting use with activity) Cough interfering with sleep two time or less a month Oral steroids no more than once a year No hospitalizations   Eczema Eczema flares exacerbated by dog allergy, affecting neck, eyelids, forehead, and ears. Dupixent is considered to improve eczema control by reducing the allergy cascade. - Prescribe a nonsteroid ointment, Protopic ointment, use twice a day as needed for eczema flares.  This can be used anywhere on body.   - Continue your Triamcinolone ointment (for milder flares on body) and Clobetasol ointment  (for severe flares on body) - Daily moisturization after bathing with a scent-free and dye-free moisturizer - Discuss Dupixent as a potential treatment option.  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 Reaction to coleslaw causing throat itchiness and discomfort. Milk causes nausea and stomach pain, but not lactose intolerant and tolerates cheese, yogurts, ice cream.  - Order blood work to assess specific food allergies - fish and shellfish - Continue avoiding fish, shellfish and coleslaw in diet - Follow emergency action plan in case of allergic reaction and have access to Epipen device 0.3mg    Follow-up She needs to discuss treatment options with her sister and return for lab work. Lab work will help determine the appropriateness of Dupixent and assess allergy profile. Routine visit in 3-4 months or sooner if needed

## 2023-04-29 ENCOUNTER — Ambulatory Visit (INDEPENDENT_AMBULATORY_CARE_PROVIDER_SITE_OTHER): Admitting: Clinical

## 2023-04-29 DIAGNOSIS — F411 Generalized anxiety disorder: Secondary | ICD-10-CM

## 2023-04-30 ENCOUNTER — Ambulatory Visit (HOSPITAL_COMMUNITY): Admitting: Clinical

## 2023-05-01 NOTE — Progress Notes (Signed)
 THERAPIST PROGRESS NOTE Virtual Visit via Video Note  I connected with Bessye Laury Axon on 04/29/2023 at  4:00 PM EDT by a video enabled telemedicine application and verified that I am speaking with the correct person using two identifiers.  Location: Patient: home Provider: office   I discussed the limitations of evaluation and management by telemedicine and the availability of in person appointments. The patient expressed understanding and agreed to proceed.   Follow Up Instructions: I discussed the assessment and treatment plan with the patient. The patient was provided an opportunity to ask questions and all were answered. The patient agreed with the plan and demonstrated an understanding of the instructions.   The patient was advised to call back or seek an in-person evaluation if the symptoms worsen or if the condition fails to improve as anticipated.   Session Time: 30 minutes  Participation Level: Active  Behavioral Response: CasualAlertAnxious  Type of Therapy: Individual Therapy  Treatment Goals addressed: Chaitra WILL SCORE LESS THAN 10 ON THE PATIENT HEALTH QUESTIONNAIRE (PHQ-9)  ProgressTowards Goals: Progressing  Interventions: CBT and Supportive  Summary:  Blenda ALANAH SAKUMA is a 23 y.o. female who presents for the scheduled appointment oriented x 5, appropriately dressed, and friendly.  Client denied hallucinations and delusions. Client reported on today she is trying to maintain her best but still experiencing the same stressors.  Client reported [facility]  Client reported however there is another female client who rides the bus on her route to and from the program that has been antagonizing her.  Client reported he makes a lot of rude comments to her unprovoked.  Client reported when she has tried to tell staff they disregard her concerns and said it is because of his mental disability.  Client reported she has  been trying to find another day program to go to during the day.  Client reported her home environment has also been stressful due to having allergies to her roommate's dog.  Client reported she is not sure where else she can look to stay that would be affordable for her. Evidence of progress towards goal: Client reported 1 positive taken initiated to work on improving stressors that she identified regarding housing and her psychosocial day program.  Suicidal/Homicidal: Nowithout intent/plan  Therapist Response:  Therapist began the appointment asking the client how she has been doing since last seen. Therapist engaged with active listening and positive emotional support. Therapist used CBT to give the client time to discuss her thoughts and feelings about her stressors that are currently ongoing. Therapist used CBT to normalize clients emotional response and reinforcing her communication and advocacy for herself. Therapist gave the client information to other mental health services in the area that may offer some community support services. Therapist used CBT ask the client to identify her progress with frequency of use with coping skills with continued practice in her daily activity.    Therapist assigned client homework to contact the resources that were provided for her.   Plan: Return again in 4 weeks.  Diagnosis: GAD  Collaboration of Care: Patient refused AEB none requested by the client.  Patient/Guardian was advised Release of Information must be obtained prior to any record release in order to collaborate their care with an outside provider. Patient/Guardian was advised if they have not already done so to contact the registration department to sign all necessary forms in order for [place] to release information regarding their  care.   Consent: Patient/Guardian gives verbal consent for treatment and assignment of benefits for services provided during this visit. Patient/Guardian expressed  understanding and agreed to proceed.   Neena Rhymes Mathius Birkeland, LCSW 04/29/2023

## 2023-05-07 ENCOUNTER — Ambulatory Visit (INDEPENDENT_AMBULATORY_CARE_PROVIDER_SITE_OTHER): Admitting: Allergy

## 2023-05-07 ENCOUNTER — Other Ambulatory Visit: Payer: Self-pay

## 2023-05-07 ENCOUNTER — Encounter: Payer: Self-pay | Admitting: Allergy

## 2023-05-07 VITALS — BP 100/60 | HR 102 | Temp 98.2°F | Resp 12 | Wt 164.5 lb

## 2023-05-07 DIAGNOSIS — T7800XD Anaphylactic reaction due to unspecified food, subsequent encounter: Secondary | ICD-10-CM

## 2023-05-07 DIAGNOSIS — J3089 Other allergic rhinitis: Secondary | ICD-10-CM

## 2023-05-07 DIAGNOSIS — L2084 Intrinsic (allergic) eczema: Secondary | ICD-10-CM | POA: Diagnosis not present

## 2023-05-07 DIAGNOSIS — L2089 Other atopic dermatitis: Secondary | ICD-10-CM

## 2023-05-07 DIAGNOSIS — J4540 Moderate persistent asthma, uncomplicated: Secondary | ICD-10-CM | POA: Diagnosis not present

## 2023-05-07 DIAGNOSIS — H109 Unspecified conjunctivitis: Secondary | ICD-10-CM

## 2023-05-07 NOTE — Progress Notes (Signed)
 Follow-up Note  RE: DYLIN IHNEN MRN: [id number] DOB: 06-02-2000 Date of Office Visit: 05/07/2023   History of present illness: Ariel Hernandez is a 23 y.o. female presenting today for follow-up of asthma, eczema, allergic rhinitis with conjunctivitis, food allergy.  She presents today with her sister so that she can also get information regarding Dupixent and ways to move forward. Discussed the use of AI scribe software for clinical note transcription with the patient, who gave verbal consent to proceed.  She has been experiencing persistent itching and redness on her neck and a severe breakout since last visit however she does feel like her eczema areas feel a bit smoother than her initial visit. She has been applying creams to manage these symptoms which include the triamcinolone, clobetasol and nonsteroid ointment Protopic.  We discussed Dupixent at last visit but she wanted to make sure his sister had a good understanding of Dupixent before moving forward as well as to make sure there was good insurance coverage.  She continues to experience chest tightness in.  Remedies such as an air purifier, inhalers, and hot showers have been used to alleviate her symptoms.  She states she is using the Symbicort 2 puffs twice a day and the Singulair as directed.  She does still need to use her rescue inhaler.  However she states the dog in the home will not be in the home over this weekend so she will get a bit of a break which also helps with her eczema control to.  She has been taking Xyzal, which she started last week, and finds it more effective than Claritin for managing her allergy symptoms. She also uses Flonase for nasal symptoms but has not yet used eye drops.  She did return after last visit have her lab work done and her CBC has returned and resulted as below.  Rest of her labs are still pending.  Review of systems: 10pt ROS negative unless noted above in HPI  Past  medical/social/surgical/family history have been reviewed and are unchanged unless specifically indicated below.  No changes  Medication List: Current Outpatient Medications  Medication Sig Dispense Refill   albuterol (PROAIR HFA) 108 (90 Base) MCG/ACT inhaler Inhale 2 puffs into the lungs every 4 (four) hours as needed (cough). 36 g 5   albuterol (PROVENTIL) (2.5 MG/3ML) 0.083% nebulizer solution Take 3 mLs (2.5 mg total) by nebulization every 4 (four) hours as needed for wheezing or shortness of breath. 75 mL 2   ALTAVERA 0.15-30 MG-MCG tablet Take 1 tablet by mouth daily.     budesonide-formoterol (SYMBICORT) 160-4.5 MCG/ACT inhaler Inhale into the lungs. Inhale two puffs into the lungs 2 (two) times daily.     busPIRone (BUSPAR) 30 MG tablet Take 1 tablet (30 mg total) by mouth 2 (two) times daily. 60 tablet 2   EPINEPHrine 0.3 mg/0.3 mL IJ SOAJ injection Inject 0.3 mg into the muscle as needed for anaphylaxis. 2 each 1   FLUoxetine (PROZAC) 20 MG capsule Take 1 capsule (20 mg total) by mouth every morning. 90 capsule 0   fluticasone (FLONASE) 50 MCG/ACT nasal spray Place 2 sprays into both nostrils daily. 16 g 2   lamoTRIgine (LAMICTAL) 200 MG tablet Take 1 tablet (200 mg total) by mouth at bedtime. 30 tablet 2   levocetirizine (XYZAL) 5 MG tablet Take 1 tablet (5 mg total) by mouth every evening. 30 tablet 2   MIRALAX 17 GM/SCOOP powder Take 17 g by mouth daily.  montelukast (SINGULAIR) 10 MG tablet Take 1 tablet (10 mg total) by mouth at bedtime. 30 tablet 11   Olopatadine HCl 0.2 % SOLN Apply 1 drop to eye daily. 2.5 mL 2   QUEtiapine (SEROQUEL) 300 MG tablet Take 1 tablet (300 mg total) by mouth at bedtime. 30 tablet 1   triamcinolone cream (KENALOG) 0.1 % APPLY TO AFFECTED AREA TWICE A DAY 453.6 g 0   clobetasol cream (TEMOVATE) 0.05 % Apply 1 Application topically 2 (two) times daily. (Patient not taking: Reported on 05/07/2023) 60 g 1   loratadine (CLARITIN) 10 MG tablet Take 1  tablet (10 mg total) by mouth daily. (Patient not taking: Reported on 05/07/2023) 30 tablet 11   No current facility-administered medications for this visit.     Known medication allergies: Allergies  Allergen Reactions   Dexmethylphenidate Hcl Shortness Of Breath   Fish-Derived Products Shortness Of Breath   Grass Pollen(K-O-R-T-Swt Vern) Itching   Other Diarrhea, Nausea And Vomiting and Itching    Fish, milk   Hydrocortisone Rash   Fish Allergy    Focalin [Dexmethylphenidate]    Bee Pollen Itching and Rash   Milk (Cow) Diarrhea, Nausea And Vomiting and Palpitations     Physical examination: Blood pressure 100/60, pulse (!) 102, temperature 98.2 F (36.8 C), resp. rate 12, weight 164 lb 8 oz (74.6 kg), last menstrual period 03/23/2023, SpO2 98%.  General: Alert, interactive, in no acute distress. HEENT: PERRLA, TMs pearly gray, turbinates mildly edematous without discharge, post-pharynx non erythematous. Neck: Supple without lymphadenopathy. Lungs: Mildly decreased breath sounds bilaterally without wheezing, rhonchi or rales. {no increased work of breathing. CV: Normal S1, S2 without murmurs. Abdomen: Nondistended, nontender. Skin: Dry, erythematous, excoriated patches with mild scaling of the lateral edges of the nape of the neck, bilateral upper lids, forehead around the hairline . Extremities:  No clubbing, cyanosis or edema. Neuro:   Grossly intact.  Diagnositics/Labs: Labs:  Component     Latest Ref Rng 05/01/2023  WBC     3.4 - 10.8 x10E3/uL 10.4   RBC     3.77 - 5.28 x10E6/uL 4.68   Hemoglobin     11.1 - 15.9 g/dL 36.6   HCT     44.0 - 34.7 % 41.8   MCV     79 - 97 fL 89   MCH     26.6 - 33.0 pg 30.8   MCHC     31.5 - 35.7 g/dL 42.5   RDW     95.6 - 38.7 % 12.7   Platelets     150 - 450 x10E3/uL 319   Neutrophils     Not Estab. % 69   Lymphs     Not Estab. % 20   Monocytes     Not Estab. % 6   Eos     Not Estab. % 4   Basos     Not Estab. % 1    NEUT#     1.4 - 7.0 x10E3/uL 7.2 (H)   Lymphs Abs     0.7 - 3.1 x10E3/uL 2.0   Monocytes Absolute     0.1 - 0.9 x10E3/uL 0.6   EOS (ABSOLUTE)     0.0 - 0.4 x10E3/uL 0.4   Basophils Absolute     0.0 - 0.2 x10E3/uL 0.1   Immature Granulocytes     Not Estab. % 0   Immature Grans (Abs)     0.0 - 0.1 x10E3/uL 0.0     Spirometry: FEV1:  2.57 L or 103%, FVC: 3.63 L 131%, ratio consistent with nonobstructive pattern  Assessment and plan:   Allergic rhinitis Severe allergy to dogs causing rhinorrhea, nasal congestion, sneezing, conjunctivitis, and asthma exacerbation. Allergy immunotherapy may be beneficial but requires asthma control first. - Continue Xyzal 5mg  daily at this time. - Continue Flonase 2 sprays each nostril daily for 1-2 weeks at a time before stopping once nasal congestion improves for maximum benefit - Can use Olopatadine 0.2% 1 drop each eye daily as needed - Consider allergy immunotherapy once asthma is controlled.  Asthma Asthma remains not well controlled. Dupixent is considered to improve control and is administered biweekly via autoinjector. Dupixent can be administered at home or in office. 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 - Have access to albuterol inhaler 2 puffs every 4-6 hours as needed for cough/wheeze/shortness of breath/chest tightness.  May use 15-20 minutes prior to activity.   Monitor frequency of use.   - Discussed Dupixent as a potential treatment option.  Tammy will call you regarding this medication and ordering.   Asthma control goals:  Full participation in all desired activities (may need albuterol before activity) Albuterol use two time or less a week on average (not counting use with activity) Cough interfering with sleep two time or less a month Oral steroids no more than once a year No  hospitalizations  Eczema Eczema flares exacerbated by dog allergy, affecting neck, eyelids, forehead, and ears. Dupixent is considered to improve eczema control by reducing the allergy cascade. - Use Protopic ointment twice a day as needed for eczema flares.  This can be used anywhere on body.  This is a non-steroid ointment.  - Use Triamcinolone ointment (for milder flares on body) and Clobetasol ointment (for severe flares on body) - Daily moisturization after bathing with a scent-free and dye-free moisturizer - Discussed Dupixent as a potential treatment option.  Will plan to start this once Tammy discussed insurance coverage with you.   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 Reaction to coleslaw causing throat itchiness and discomfort. Milk causes nausea and stomach pain, but not lactose intolerant and tolerates cheese, yogurts, ice cream.  - Order blood work to assess specific food allergies - fish and shellfish - Continue avoiding fish, shellfish and coleslaw in diet - Follow emergency action plan in case of allergic reaction and have access to Epipen device 0.3mg    Routine visit in 3-4 months or sooner if needed  I appreciate the opportunity to take part in Yuliza's care. Please do not hesitate to contact me with questions.  Sincerely,   Catha Clink, MD Allergy/Immunology [facility]

## 2023-05-07 NOTE — Patient Instructions (Addendum)
 Allergic rhinitis Severe allergy to dogs causing rhinorrhea, nasal congestion, sneezing, conjunctivitis, and asthma exacerbation. Allergy immunotherapy may be beneficial but requires asthma control first. - Continue Xyzal 5mg  daily at this time. - Continue Flonase 2 sprays each nostril daily for 1-2 weeks at a time before stopping once nasal congestion improves for maximum benefit - Can use Olopatadine 0.2% 1 drop each eye daily as needed - Consider allergy immunotherapy once asthma is controlled.  Asthma Asthma remains not well controlled. Dupixent is considered to improve control and is administered biweekly via autoinjector. Dupixent can be administered at home or in office. 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 - Have access to albuterol inhaler 2 puffs every 4-6 hours as needed for cough/wheeze/shortness of breath/chest tightness.  May use 15-20 minutes prior to activity.   Monitor frequency of use.   - Discussed Dupixent as a potential treatment option.  Tammy will call you regarding this medication and ordering.   Asthma control goals:  Full participation in all desired activities (may need albuterol before activity) Albuterol use two time or less a week on average (not counting use with activity) Cough interfering with sleep two time or less a month Oral steroids no more than once a year No hospitalizations  Eczema Eczema flares exacerbated by dog allergy, affecting neck, eyelids, forehead, and ears. Dupixent is considered to improve eczema control by reducing the allergy cascade. - Use Protopic ointment twice a day as needed for eczema flares.  This can be used anywhere on body.  This is a non-steroid ointment.  - Use Triamcinolone ointment (for milder flares on body) and Clobetasol ointment (for severe flares on body) - Daily moisturization after  bathing with a scent-free and dye-free moisturizer - Discussed Dupixent as a potential treatment option.  Will plan to start this once Tammy discussed insurance coverage with you.   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 Reaction to coleslaw causing throat itchiness and discomfort. Milk causes nausea and stomach pain, but not lactose intolerant and tolerates cheese, yogurts, ice cream.  - Order blood work to assess specific food allergies - fish and shellfish - Continue avoiding fish, shellfish and coleslaw in diet - Follow emergency action plan in case of allergic reaction and have access to Epipen device 0.3mg    Follow-up Will call you with rest of your results once they all return Tammy will call you and she works out of our [employer] office and her # is [phone]. Routine visit in 3-4 months or sooner if needed

## 2023-05-11 LAB — ALLERGENS W/TOTAL IGE AREA 2
Aspergillus Fumigatus IgE: 0.16 kU/L — AB
Bermuda Grass IgE: 1.42 kU/L — AB
Cat Dander IgE: 91.4 kU/L — AB
Cedar, Mountain IgE: 0.16 kU/L — AB
Cladosporium Herbarum IgE: 0.28 kU/L — AB
Common Silver Birch IgE: 6.06 kU/L — AB
Cottonwood IgE: 0.45 kU/L — AB
D Pteronyssinus IgE: 0.1 kU/L — AB
Elm, American IgE: 0.46 kU/L — AB
IgE (Immunoglobulin E), Serum: 684 [IU]/mL — ABNORMAL HIGH (ref 6–495)
Johnson Grass IgE: 1.4 kU/L — AB
Maple/Box Elder IgE: 0.15 kU/L — AB
Mouse Urine IgE: 0.47 kU/L — AB
Oak, White IgE: 5.9 kU/L — AB
Pecan, Hickory IgE: 0.38 kU/L — AB
Ragweed, Short IgE: 0.18 kU/L — AB
Timothy Grass IgE: 4.04 kU/L — AB

## 2023-05-11 LAB — ALLERGEN PROFILE, FOOD-FISH
Allergen Mackerel IgE: 0.34 kU/L — AB
Allergen Salmon IgE: 1 kU/L — AB
Allergen Trout IgE: 1.77 kU/L — AB
Allergen Walley Pike IgE: 1.46 kU/L — AB
Codfish IgE: 3.54 kU/L — AB
Halibut IgE: 1.25 kU/L — AB
Tuna: 0.3 kU/L — AB

## 2023-05-11 LAB — CBC WITH DIFFERENTIAL/PLATELET
Basophils Absolute: 0.1 10*3/uL (ref 0.0–0.2)
Basos: 1 %
EOS (ABSOLUTE): 0.4 10*3/uL (ref 0.0–0.4)
Eos: 4 %
Hematocrit: 41.8 % (ref 34.0–46.6)
Hemoglobin: 14.4 g/dL (ref 11.1–15.9)
Immature Grans (Abs): 0 10*3/uL (ref 0.0–0.1)
Immature Granulocytes: 0 %
Lymphocytes Absolute: 2 10*3/uL (ref 0.7–3.1)
Lymphs: 20 %
MCH: 30.8 pg (ref 26.6–33.0)
MCHC: 34.4 g/dL (ref 31.5–35.7)
MCV: 89 fL (ref 79–97)
Monocytes Absolute: 0.6 10*3/uL (ref 0.1–0.9)
Monocytes: 6 %
Neutrophils Absolute: 7.2 10*3/uL — ABNORMAL HIGH (ref 1.4–7.0)
Neutrophils: 69 %
Platelets: 319 10*3/uL (ref 150–450)
RBC: 4.68 x10E6/uL (ref 3.77–5.28)
RDW: 12.7 % (ref 11.7–15.4)
WBC: 10.4 10*3/uL (ref 3.4–10.8)

## 2023-05-11 LAB — ALLERGEN PROFILE, SHELLFISH
F080-IgE Lobster: 0.15 kU/L — AB
F290-IgE Oyster: 0.12 kU/L — AB

## 2023-05-13 ENCOUNTER — Other Ambulatory Visit (HOSPITAL_COMMUNITY): Payer: Self-pay

## 2023-05-13 ENCOUNTER — Telehealth: Payer: Self-pay | Admitting: *Deleted

## 2023-05-13 MED ORDER — DUPIXENT 300 MG/2ML ~~LOC~~ SOSY
600.0000 mg | PREFILLED_SYRINGE | Freq: Once | SUBCUTANEOUS | 11 refills | Status: AC
  Filled 2023-05-14: qty 4, 14d supply, fill #0
  Filled 2023-05-26: qty 4, 14d supply, fill #1
  Filled 2023-06-30: qty 4, 28d supply, fill #2

## 2023-05-13 NOTE — Telephone Encounter (Signed)
-----   Message from [facility] sent at 05/07/2023  5:11 PM EDT ----- Would like patient to start on Dupixent .  She has both eczema and asthma.  Her eosinophil count is 400.  I do not really care which indication she gets it approved under.  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  Her sister was at the visit today and her main concern was insurance coverage as it does not sound like she has filings to pay any out-of-pocket costs.  I advise you would tell them about any patient assistance if she is eligible and what her insurance will cover

## 2023-05-13 NOTE — Telephone Encounter (Signed)
L/m for patient to contact me to advise approval and submit to [facility] for Dupixent

## 2023-05-13 NOTE — Telephone Encounter (Signed)
 Spoke to patient and advised submit and will reach out to schedule loading dose start once delivery set. She wlll get initial dose in clinic with admin instructions then can home admin

## 2023-05-14 ENCOUNTER — Other Ambulatory Visit: Payer: Self-pay

## 2023-05-14 ENCOUNTER — Other Ambulatory Visit (HOSPITAL_COMMUNITY): Payer: Self-pay

## 2023-05-14 NOTE — Progress Notes (Signed)
 Specialty Pharmacy Initial Fill Coordination Note  Ariel Hernandez is a 23 y.o. female contacted today regarding initial fill of specialty medication(s) Dupilumab  (Dupixent )   Patient requested Courier to Provider Office   Delivery date: 05/19/23   Verified address: [address] [place] [address]   Medication will be filled on 04/28.   Patient is aware of $4.80 copayment.

## 2023-05-14 NOTE — Progress Notes (Signed)
 Specialty Pharmacy Initiation Note   Ariel Hernandez is a 23 y.o. female who will be followed by the specialty pharmacy service for RxSp Atopic Dermatitis    Review of administration, indication, effectiveness, safety, potential side effects, storage/disposable, and missed dose instructions occurred today for patient's specialty medication(s) Dupilumab  (Dupixent )     Patient/Caregiver did not have any additional questions or concerns.   Patient's therapy is appropriate to: Initiate    Goals Addressed             This Visit's Progress    Minimize recurrence of flares       Patient is initiating therapy. Patient will maintain adherence        Will follow up in 6 months, patient was encouraged to call if any questions or concerns arise.   Malachi Screws Specialty Pharmacist

## 2023-05-15 ENCOUNTER — Encounter: Payer: Self-pay | Admitting: Allergy

## 2023-05-18 ENCOUNTER — Other Ambulatory Visit: Payer: Self-pay

## 2023-05-21 ENCOUNTER — Ambulatory Visit

## 2023-05-21 DIAGNOSIS — L209 Atopic dermatitis, unspecified: Secondary | ICD-10-CM | POA: Diagnosis not present

## 2023-05-21 MED ORDER — DUPILUMAB 300 MG/2ML ~~LOC~~ SOSY
600.0000 mg | PREFILLED_SYRINGE | Freq: Once | SUBCUTANEOUS | Status: AC
  Administered 2023-05-21: 600 mg via SUBCUTANEOUS

## 2023-05-21 NOTE — Progress Notes (Signed)
 Immunotherapy   Patient Details  Name: SUSANNAH KROLIK MRN: [id number] Date of Birth: 2000/05/21  05/21/2023  Nyeemah Florentina Huntsman started injections for eczema. Patient received a 600 mg loading dose of Dupixent . Patient waited 15 minutes with no problems.  Frequency: every 14 days Consent signed and patient instructions given.   Denton Flakes 05/21/2023, 1:36 PM

## 2023-05-22 ENCOUNTER — Other Ambulatory Visit: Payer: Self-pay

## 2023-05-25 ENCOUNTER — Other Ambulatory Visit (HOSPITAL_COMMUNITY): Payer: Self-pay

## 2023-05-26 ENCOUNTER — Other Ambulatory Visit: Payer: Self-pay

## 2023-05-26 ENCOUNTER — Other Ambulatory Visit (HOSPITAL_COMMUNITY): Payer: Self-pay

## 2023-05-26 NOTE — Progress Notes (Signed)
 Specialty Pharmacy Refill Coordination Note  Ariel Hernandez is a 23 y.o. female contacted today regarding refills of specialty medication(s) Dupilumab  (Dupixent )   Patient requested Courier to Provider Office   Delivery date: 06/01/23   Verified address: [address] [place] [address]   Medication will be filled on 05/29/23.

## 2023-05-28 ENCOUNTER — Ambulatory Visit (INDEPENDENT_AMBULATORY_CARE_PROVIDER_SITE_OTHER): Admitting: Clinical

## 2023-05-28 ENCOUNTER — Other Ambulatory Visit (HOSPITAL_COMMUNITY): Payer: Self-pay | Admitting: Student

## 2023-05-28 DIAGNOSIS — F39 Unspecified mood [affective] disorder: Secondary | ICD-10-CM

## 2023-05-28 DIAGNOSIS — F605 Obsessive-compulsive personality disorder: Secondary | ICD-10-CM

## 2023-05-28 DIAGNOSIS — F79 Unspecified intellectual disabilities: Secondary | ICD-10-CM

## 2023-05-28 DIAGNOSIS — F411 Generalized anxiety disorder: Secondary | ICD-10-CM

## 2023-05-28 DIAGNOSIS — F4310 Post-traumatic stress disorder, unspecified: Secondary | ICD-10-CM

## 2023-05-28 DIAGNOSIS — Z79899 Other long term (current) drug therapy: Secondary | ICD-10-CM
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
## 2023-05-29 ENCOUNTER — Other Ambulatory Visit: Payer: Self-pay

## 2023-06-01 ENCOUNTER — Other Ambulatory Visit: Payer: Self-pay

## 2023-06-02 ENCOUNTER — Other Ambulatory Visit (HOSPITAL_COMMUNITY): Payer: Self-pay

## 2023-06-04 ENCOUNTER — Ambulatory Visit

## 2023-06-04 DIAGNOSIS — L209 Atopic dermatitis, unspecified: Secondary | ICD-10-CM

## 2023-06-04 MED ORDER — DUPILUMAB 300 MG/2ML ~~LOC~~ SOSY
300.0000 mg | PREFILLED_SYRINGE | SUBCUTANEOUS | Status: AC
  Administered 2023-06-04 – 2024-02-10 (×8): 300 mg via SUBCUTANEOUS

## 2023-06-17 ENCOUNTER — Ambulatory Visit (INDEPENDENT_AMBULATORY_CARE_PROVIDER_SITE_OTHER): Admitting: Clinical

## 2023-06-17 ENCOUNTER — Encounter (HOSPITAL_COMMUNITY): Payer: Self-pay

## 2023-06-17 DIAGNOSIS — F39 Unspecified mood [affective] disorder: Secondary | ICD-10-CM | POA: Diagnosis not present

## 2023-06-18 ENCOUNTER — Ambulatory Visit (INDEPENDENT_AMBULATORY_CARE_PROVIDER_SITE_OTHER): Admitting: Student

## 2023-06-18 DIAGNOSIS — F411 Generalized anxiety disorder: Secondary | ICD-10-CM | POA: Diagnosis not present

## 2023-06-18 DIAGNOSIS — F4310 Post-traumatic stress disorder, unspecified: Secondary | ICD-10-CM

## 2023-06-18 DIAGNOSIS — Z79899 Other long term (current) drug therapy: Secondary | ICD-10-CM

## 2023-06-18 DIAGNOSIS — F39 Unspecified mood [affective] disorder: Secondary | ICD-10-CM

## 2023-06-18 DIAGNOSIS — F79 Unspecified intellectual disabilities: Secondary | ICD-10-CM

## 2023-06-18 DIAGNOSIS — F605 Obsessive-compulsive personality disorder: Secondary | ICD-10-CM | POA: Diagnosis not present

## 2023-06-18 DIAGNOSIS — Z8659 Personal history of other mental and behavioral disorders: Secondary | ICD-10-CM

## 2023-06-18 MED ORDER — LAMOTRIGINE 200 MG PO TABS: 200.0000 mg | ORAL_TABLET | Freq: Every day | ORAL | 2 refills | Status: DC

## 2023-06-18 MED ORDER — FLUOXETINE HCL 40 MG PO CAPS: 40.0000 mg | ORAL_CAPSULE | Freq: Every morning | ORAL | 1 refills | Status: DC

## 2023-06-18 MED ORDER — QUETIAPINE FUMARATE 300 MG PO TABS: 300.0000 mg | ORAL_TABLET | Freq: Every day | ORAL | 1 refills | Status: DC

## 2023-06-18 MED ORDER — BUSPIRONE HCL 30 MG PO TABS: 30.0000 mg | ORAL_TABLET | Freq: Two times a day (BID) | ORAL | 2 refills | Status: AC
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
## 2023-06-20 ENCOUNTER — Ambulatory Visit (HOSPITAL_COMMUNITY)
Admission: EM | Admit: 2023-06-20 | Discharge: 2023-06-23 | Disposition: A | Discharge status: Other Acute Inpatient Hospital | Attending: Psychiatry | Admitting: Psychiatry

## 2023-06-20 DIAGNOSIS — F329 Major depressive disorder, single episode, unspecified: Secondary | ICD-10-CM | POA: Diagnosis not present

## 2023-06-20 DIAGNOSIS — F411 Generalized anxiety disorder: Secondary | ICD-10-CM

## 2023-06-20 DIAGNOSIS — F3481 Disruptive mood dysregulation disorder: Secondary | ICD-10-CM

## 2023-06-20 DIAGNOSIS — R45851 Suicidal ideations: Secondary | ICD-10-CM

## 2023-06-20 MED ORDER — HALOPERIDOL 5 MG PO TABS
5.0000 mg | ORAL_TABLET | Freq: Three times a day (TID) | ORAL | Status: DC | PRN
  Administered 2023-06-22 – 2023-06-23 (×5): 5 mg via ORAL
  Filled 2023-06-20 (×6): qty 1

## 2023-06-20 MED ORDER — MAGNESIUM HYDROXIDE 400 MG/5ML PO SUSP: 30.0000 mL | Freq: Every day | ORAL | Status: DC | PRN

## 2023-06-20 MED ORDER — HALOPERIDOL LACTATE 5 MG/ML IJ SOLN
5.0000 mg | Freq: Three times a day (TID) | INTRAMUSCULAR | Status: DC | PRN
  Administered 2023-06-21: 5 mg via INTRAMUSCULAR
  Filled 2023-06-20: qty 1

## 2023-06-20 MED ORDER — DIPHENHYDRAMINE HCL 50 MG/ML IJ SOLN
50.0000 mg | Freq: Three times a day (TID) | INTRAMUSCULAR | Status: DC | PRN
  Administered 2023-06-21: 50 mg via INTRAMUSCULAR

## 2023-06-20 MED ORDER — ACETAMINOPHEN 325 MG PO TABS
650.0000 mg | ORAL_TABLET | Freq: Four times a day (QID) | ORAL | Status: DC | PRN
  Administered 2023-06-21 (×2): 650 mg via ORAL
  Filled 2023-06-20 (×2): qty 2

## 2023-06-20 MED ORDER — LORAZEPAM 2 MG/ML IJ SOLN
2.0000 mg | Freq: Three times a day (TID) | INTRAMUSCULAR | Status: DC | PRN
  Administered 2023-06-21: 2 mg via INTRAMUSCULAR
  Filled 2023-06-20: qty 1

## 2023-06-20 MED ORDER — HALOPERIDOL LACTATE 5 MG/ML IJ SOLN: 10.0000 mg | Freq: Three times a day (TID) | INTRAMUSCULAR | Status: DC | PRN

## 2023-06-20 MED ORDER — DIPHENHYDRAMINE HCL 50 MG/ML IJ SOLN
25.0000 mg | Freq: Once | INTRAMUSCULAR | Status: AC
  Administered 2023-06-20: 25 mg via INTRAMUSCULAR
  Filled 2023-06-20: qty 1

## 2023-06-20 MED ORDER — DIPHENHYDRAMINE HCL 50 MG PO CAPS
50.0000 mg | ORAL_CAPSULE | Freq: Three times a day (TID) | ORAL | Status: DC | PRN
  Administered 2023-06-21 – 2023-06-23 (×5): 50 mg via ORAL
  Filled 2023-06-20 (×5): qty 1

## 2023-06-20 MED ORDER — LORAZEPAM 2 MG/ML IJ SOLN
2.0000 mg | Freq: Once | INTRAMUSCULAR | Status: AC
  Administered 2023-06-20: 2 mg via INTRAMUSCULAR
  Filled 2023-06-20: qty 1

## 2023-06-20 MED ORDER — ALUM & MAG HYDROXIDE-SIMETH 200-200-20 MG/5ML PO SUSP: 30.0000 mL | ORAL | Status: DC | PRN

## 2023-06-20 MED ORDER — ZIPRASIDONE MESYLATE 20 MG IM SOLR
20.0000 mg | Freq: Once | INTRAMUSCULAR | Status: AC
  Administered 2023-06-20: 20 mg via INTRAMUSCULAR
  Filled 2023-06-20: qty 20

## 2023-06-20 MED ORDER — LORAZEPAM 2 MG/ML IJ SOLN: 2.0000 mg | Freq: Three times a day (TID) | INTRAMUSCULAR | Status: DC | PRN

## 2023-06-20 MED ORDER — DIPHENHYDRAMINE HCL 50 MG/ML IJ SOLN
50.0000 mg | Freq: Three times a day (TID) | INTRAMUSCULAR | Status: DC | PRN
  Filled 2023-06-20: qty 1
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
## 2023-06-20 NOTE — ED Notes (Signed)
 A piece of thermostat from Assessment rm 135 was discovered in pt's mouth by MHT Baldo Levan, and NP Elston Halsted., who was sitting in hall while pt being assessed by NP Elston Halsted..  Pt spit foreign body into her hand. And was retrieved by MHT Marshae.  NP Veronique initiated the STARR.  Orders placed.  Face to Face Completed by NP Elston Halsted.  SEE STARR FLOWSHEET.

## 2023-06-21 DIAGNOSIS — F411 Generalized anxiety disorder: Secondary | ICD-10-CM

## 2023-06-21 MED ORDER — BUSPIRONE HCL 15 MG PO TABS
30.0000 mg | ORAL_TABLET | Freq: Two times a day (BID) | ORAL | Status: DC
  Administered 2023-06-21 – 2023-06-23 (×4): 30 mg via ORAL
  Filled 2023-06-21 (×4): qty 2

## 2023-06-21 MED ORDER — LORAZEPAM 1 MG PO TABS
2.0000 mg | ORAL_TABLET | Freq: Once | ORAL | Status: AC
  Administered 2023-06-21: 2 mg via ORAL
  Filled 2023-06-21: qty 2

## 2023-06-21 MED ORDER — QUETIAPINE FUMARATE 300 MG PO TABS
300.0000 mg | ORAL_TABLET | Freq: Every day | ORAL | Status: DC
  Administered 2023-06-21 – 2023-06-22 (×2): 300 mg via ORAL
  Filled 2023-06-21 (×2): qty 1

## 2023-06-21 MED ORDER — MONTELUKAST SODIUM 10 MG PO TABS
10.0000 mg | ORAL_TABLET | Freq: Every day | ORAL | Status: DC
  Administered 2023-06-21 – 2023-06-22 (×2): 10 mg via ORAL
  Filled 2023-06-21 (×2): qty 1

## 2023-06-21 MED ORDER — ALBUTEROL SULFATE HFA 108 (90 BASE) MCG/ACT IN AERS: 2.0000 | INHALATION_SPRAY | RESPIRATORY_TRACT | Status: DC | PRN

## 2023-06-21 MED ORDER — LEVONORGESTREL-ETHINYL ESTRAD 0.15-30 MG-MCG PO TABS: 1.0000 | ORAL_TABLET | Freq: Every day | ORAL | Status: DC

## 2023-06-21 MED ORDER — HALOPERIDOL 5 MG PO TABS
10.0000 mg | ORAL_TABLET | Freq: Once | ORAL | Status: AC
  Administered 2023-06-21: 10 mg via ORAL
  Filled 2023-06-21: qty 2

## 2023-06-21 MED ORDER — POLYETHYLENE GLYCOL 3350 17 GM/SCOOP PO POWD
17.0000 g | Freq: Every day | ORAL | Status: DC
  Filled 2023-06-21: qty 119

## 2023-06-21 MED ORDER — FLUOXETINE HCL 20 MG PO CAPS
40.0000 mg | ORAL_CAPSULE | Freq: Every morning | ORAL | Status: DC
  Administered 2023-06-22 – 2023-06-23 (×2): 40 mg via ORAL
  Filled 2023-06-21 (×2): qty 2

## 2023-06-21 MED ORDER — LAMOTRIGINE 100 MG PO TABS
200.0000 mg | ORAL_TABLET | Freq: Every day | ORAL | Status: DC
  Administered 2023-06-21 – 2023-06-22 (×2): 200 mg via ORAL
  Filled 2023-06-21 (×2): qty 2
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
## 2023-06-21 NOTE — ED Notes (Signed)
 [facility] admission department called to see if they can take the patient tonight but moved to morning to wait 24 hrs after restrain. [facility] accepted the patient with Dr. Kondal madaram as long as patient is not restrained again within the 24 hr period.

## 2023-06-21 NOTE — ED Notes (Signed)
 PRN Tylenol given due to patient reports of generalized pain rating 7/10. Medication administered with no complications. Environment secured, safety checks in place per facility policy.

## 2023-06-21 NOTE — ED Notes (Signed)
 Patient resting in lounger with eyes closed, respirations even and unlabored. Patient in no apparent acute distress. Environment secured. Safety checks in place per facility protocol.

## 2023-06-21 NOTE — ED Notes (Addendum)
 Pt is awake, requesting a snack and juice. Gait unsteady, was instructed to sit while eating. Pt went back to sleep after her snack.
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
## 2023-06-21 NOTE — Progress Notes (Deleted)
 [facility] LCSW Progress Note   06/21/2023    12:55 PM  Ariel Hernandez   [id number]   Type of Contact and Topic:  Psychiatric Bed Placement   Pt accepted to [name]     Patient meets inpatient criteria per Nickola Baron, NP  The attending provider will be Dr. Giacomo Kung  Call report to [phone]  [facility] Ward, RN @ [facility] notified.     Pt scheduled  to arrive at [facility] for today. Please fax IVC paperwork to [phone].    Erminio Nygard, MSW, LCSW-A  12:56 PM 06/21/2023

## 2023-06-21 NOTE — Progress Notes (Signed)
 Patient responded well to the medications administered due to increased agitations and aggressive behaviors.  She was released from restraint at 22:22 and remained cooperative until she fell asleep. She is currently in bed asleep, eyes closed, respirations WNL. No sign of distress noted. Staff continue to monitor.

## 2023-06-21 NOTE — ED Notes (Signed)
 Patient alert & oriented x4. Denies intent to harm self or others when asked. Denies A/VH. Patient reports generalized pain rating 8/10, PRN tylenol  given. No acute distress noted. Patient asked when she can make a phone call and writer informed her the phones are on currently and she'll just need to dial 9 to get to along with the area code. Patient currently on phone, tearful in appearance. Support and encouragement provided. Routine safety checks conducted per facility protocol. Encouraged patient to notify staff if any thoughts of harm towards self or others arise. Patient verbalizes understanding and agreement.

## 2023-06-21 NOTE — Progress Notes (Signed)
 Patient has been denied by [facility] due to no appropriate beds available. Patient meets BH inpatient criteria per Nickola Baron, NP. Patient has been faxed out to the following facilities:   [facility] [address] [place] [place] [address] [phone]  [facility] [address] [place] [address] [phone]  [facility] [address] [place], [place] [address] [phone]  [facility] Patient Placement [facility], [place] [phone]  [facility] [address] [place] [address] [phone]  [facility] [address], [place] [address] [phone]  [facility] EFA[facility] [address] [place], [place] [phone]  [facility] Center[facility] [address] [place] [address] [phone]  [facility] [address] [place] [address] [phone]  [facility] [address] [place], [place] [address] [phone]  [facility] [address] [place] [address] [phone]  [facility] [address] [place] [address] [phone]  [facility] [address] [place] [address] [phone]  [facility] [address] [place] [address] [phone]   Phares Brasher, MSW, LCSW-A  12:46 PM 06/21/2023

## 2023-06-21 NOTE — ED Notes (Signed)
 Patient removed thermostat from wall. Staff collected face of thermostat from floor and have placed it at desk. Security at nurses' station.
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
## 2023-06-21 NOTE — ED Notes (Signed)
 PRN Tylenol  given due to patient reports of generalized pain rating 8/10. Medication administered with no complications. Environment secured, safety checks in place per facility policy.

## 2023-06-21 NOTE — Progress Notes (Signed)
 CSW spoke with the Intake RN at [facility] who stated that they will no longer be able to offer the patient a bed due to the bed no longer being available. CSW will continue to seek recommended disposition.    Sumayah Bearse, MSW, LCSW-A  1:17 PM 06/21/2023

## 2023-06-22 DIAGNOSIS — F3481 Disruptive mood dysregulation disorder: Secondary | ICD-10-CM

## 2023-06-22 DIAGNOSIS — R45851 Suicidal ideations: Secondary | ICD-10-CM

## 2023-06-22 DIAGNOSIS — F411 Generalized anxiety disorder: Secondary | ICD-10-CM | POA: Diagnosis not present

## 2023-06-22 MED ORDER — POLYETHYLENE GLYCOL 3350 17 G PO PACK
17.0000 g | PACK | Freq: Every day | ORAL | Status: DC
  Administered 2023-06-22 – 2023-06-23 (×2): 17 g via ORAL
  Filled 2023-06-22 (×2): qty 1

## 2023-06-22 NOTE — ED Notes (Signed)
 Patient was observed try to place a call and after she could not get the person on the other lane appeared upset. Patient took her medications with encouragement, now watching a movie.

## 2023-06-22 NOTE — ED Notes (Signed)
 Pt sleeping at this time. Rise and fall of chest noted. Pt in NAD at this time. Will continue to monitor.

## 2023-06-22 NOTE — ED Notes (Signed)
 Management gave approval for pt to have her stuffed animal, Rosie for comfort and coping.

## 2023-06-22 NOTE — ED Notes (Addendum)
 Called [facility] secure pager line and left a message for callback for report.

## 2023-06-22 NOTE — Progress Notes (Signed)
 THERAPIST PROGRESS NOTE Virtual Visit via Video Note  I connected with Ariel Hernandez on 06/17/2023 at  3:00 PM EDT by a video enabled telemedicine application and verified that I am speaking with the correct person using two identifiers.  Location: Patient: her sister house Provider: office   I discussed the limitations of evaluation and management by telemedicine and the availability of in person appointments. The patient expressed understanding and agreed to proceed.   Follow Up Instructions: I discussed the assessment and treatment plan with the patient. The patient was provided an opportunity to ask questions and all were answered. The patient agreed with the plan and demonstrated an understanding of the instructions.   The patient was advised to call back or seek an in-person evaluation if the symptoms worsen or if the condition fails to improve as anticipated.   Session Time: 40 minutes  Participation Level: Active  Behavioral Response: CasualAlertDepressed  Type of Therapy: Individual Therapy  Treatment Goals addressed: Reduce overall depression score by a minimum of 25% on the Patient Health Questionnaire (PHQ-9) or the Montgomery-Asberg Depression Rating Scale (MADRS)   ProgressTowards Goals: Progressing  Interventions: CBT and Supportive  Summary:  Ariel Hernandez is a 23 y.o. female who presents for the scheduled appointment oriented times five, appropriately dressed and friendly. Client denied hallucinations, delusions, suicidal and homicidal ideations. Client reported she is currently at her sister house doing laundry. Client reported [facility] Client reported it has been hard but she is trying to keep to herself respectfully. Client reported she has always wanted friends but the relationships have not lasted. Client reported she does get lonely. Client reported in particular a previous friend, an 22 year  old female, who lives in her neighborhood. Client reported she was embarrassed by how the girl told her their friendship was a mistake. Client reported client reported this particular female make the announcement in front of other people which made it a particularly negative situation which she found triggering.  Client reported there have also been instances where this girl has caused her to feel negatively about herself.  Client reported everybody that she has talked to about that situation has told her that she does not need to consider given that girl benefit of the doubt once again just to be friends.  Client reported since her Medicaid transportation has run out her sister has been helping her more with getting around to appointments since it is the summertime and her kids are out of school. Evidence of progress towards goal: Client identified 1 negative cognitive pattern of feelings related to loneliness that cause her to compromise on making healthy decisions about her boundaries.  Suicidal/Homicidal: Nowithout intent/plan  Therapist Response:  Therapist began the appointment asking the client how she has been doing since last seen. Therapist engaged with active listening and positive emotional support. Therapist used CBT to engage and ask the client how her depressive and anxiety symptoms have been within the past 2 weeks. Therapist used cbt to have the client identify the source of her depression. Therapist used cbt to teach the client about why boundaries are important, how to apply them and discuss situations where they would be appropriate. Therapist used CBT ask the client to identify her progress with frequency of use with coping skills with continued practice in her daily activity.    Therapist assigned the client homework to practice the skills discussed.   Plan:  Return again in 4 weeks.  Diagnosis: unspecified mood affective disorder  Collaboration of Care: Patient refused AEB  none requested by the client.  Patient/Guardian was advised Release of Information must be obtained prior to any record release in order to collaborate their care with an outside provider. Patient/Guardian was advised if they have not already done so to contact the registration department to sign all necessary forms in order for us  to release information regarding their care.   Consent: Patient/Guardian gives verbal consent for treatment and assignment of benefits for services provided during this visit. Patient/Guardian expressed understanding and agreed to proceed.   Rooney Gladwin Y Weylyn Ricciuti, LCSW 06/17/2023
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
## 2023-06-22 NOTE — ED Notes (Signed)
 Pt trying to pull off the badge reader from flex to adult side. Security called to unit.

## 2023-06-22 NOTE — ED Notes (Addendum)
 Pt A&Ox4, calm and cooperative, irritable and in NAD at this time. Pt took pills w/o incident. Denies SI/HI/AVH. Asking about when she can go home. Pt laid back down to go to sleep after med administration. Refused breakfast at this time.

## 2023-06-22 NOTE — Discharge Instructions (Addendum)
 Patient accepted to [facility], Dr.Madaram is the accepting MD

## 2023-06-22 NOTE — ED Notes (Addendum)
 Pt ate dinner and went back to sleep. Rise and fall of chest noted. Will continue to monitor.

## 2023-06-22 NOTE — ED Notes (Signed)
 Called Sheriff to arrange pickup time around 1140.

## 2023-06-22 NOTE — ED Notes (Signed)
 Pt is moving around in bed more frequently, eyes are closed. Pt is talking intermittently in her sleep.

## 2023-06-22 NOTE — ED Notes (Signed)
 Pt agitated about wanting to go home. Pt has her stuffed animal with her for comfort. PRN PO meds to be given.

## 2023-06-22 NOTE — Plan of Care (Signed)
  Problem: Safety: Goal: Violent Restraint(s) Outcome: Progressing

## 2023-06-22 NOTE — ED Notes (Signed)
 Pt in bed resting quietly w/no signs or symptoms of respiratory distress, pain, anxiety, or nausea/vomiting. Respirations equal and unlabored, skin warm and dry. Pt was offered support and encouragement. Pt receptive to treatment and safety maintained on unit. Routine safety checks maintained/ongoing.
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
## 2023-06-22 NOTE — ED Notes (Signed)
 Spoke w/ Amalia Badder RN from [facility]. Pt must be 2hrs post restraint w/ stable VS. Pt will be 2 hrs post restraint at 1346 which is when [facility] says to call the pager line back. Will continue to monitor pt behaviors.

## 2023-06-23 DIAGNOSIS — F411 Generalized anxiety disorder: Secondary | ICD-10-CM | POA: Diagnosis not present

## 2023-06-23 NOTE — ED Notes (Signed)
 Pt sleeping at this time. Rise and fall of chest noted. Pt in NAD at this time. Will continue to monitor.

## 2023-06-23 NOTE — ED Notes (Signed)
 Patient IVC transferred to [facility] via [place]. Pt A&Ox4, ambulatory w/ steady gait and VSS upon departure. All belongings and paperwork given to [facility].

## 2023-06-23 NOTE — ED Notes (Signed)
 Called [facility] secure pager line and left message to give updated report.

## 2023-06-23 NOTE — ED Notes (Signed)
 Updated report given to Armond Bertin RN at [facility]

## 2023-06-23 NOTE — ED Notes (Signed)
 Pt A&Ox4, calm & cooperative and in NAD at this time. Denies SI/HI/AVH. Contracts for safety. Encouragement and support given. Will continue to monitor.

## 2023-06-24 ENCOUNTER — Ambulatory Visit
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
## 2023-06-25 ENCOUNTER — Other Ambulatory Visit: Payer: Self-pay

## 2023-06-30 ENCOUNTER — Other Ambulatory Visit: Payer: Self-pay

## 2023-07-02 ENCOUNTER — Other Ambulatory Visit: Payer: Self-pay

## 2023-07-03 ENCOUNTER — Other Ambulatory Visit: Payer: Self-pay

## 2023-07-08 ENCOUNTER — Encounter (HOSPITAL_COMMUNITY): Payer: Self-pay

## 2023-07-08 ENCOUNTER — Ambulatory Visit (INDEPENDENT_AMBULATORY_CARE_PROVIDER_SITE_OTHER): Admitting: Clinical

## 2023-07-08 ENCOUNTER — Ambulatory Visit (HOSPITAL_COMMUNITY): Admitting: Clinical

## 2023-07-08 DIAGNOSIS — F331 Major depressive disorder, recurrent, moderate: Secondary | ICD-10-CM

## 2023-07-08 NOTE — Progress Notes (Signed)
 THERAPIST PROGRESS NOTE Virtual Visit via Video Note  I connected with Ariel Hernandez on 07/08/23 at  1:00 PM EDT by a video enabled telemedicine application and verified that I am speaking with the correct person using two identifiers.  Location: Patient: home Provider: office   I discussed the limitations of evaluation and management by telemedicine and the availability of in person appointments. The patient expressed understanding and agreed to proceed.   Follow Up Instructions: I discussed the assessment and treatment plan with the patient. The patient was provided an opportunity to ask questions and all were answered. The patient agreed with the plan and demonstrated an understanding of the instructions.   The patient was advised to call back or seek an in-person evaluation if the symptoms worsen or if the condition fails to improve as anticipated.  Session Time: 25 minutes  Participation Level: Active  Behavioral Response: CasualAlertEuthymic  Type of Therapy: Individual Therapy  Treatment Goals addressed: Reduce overall depression score by a minimum of 25% on the Patient Health Questionnaire (PHQ-9) or the Montgomery-Asberg Depression Rating Scale (MADRS)   ProgressTowards Goals: Not Progressing  Interventions: CBT and Supportive  Summary:  Ariel Hernandez is a 23 y.o. female who presents with a scheduled appointment oriented x 5, appropriately dressed, and friendly.  Client denied hallucinations and delusions. Client reported on today she is doing fairly okay.  Client discussed with the therapist which triggered her to go back to the hospital.  Client reported she was speaking with the girl who she was trying to be friends with which she previously noted in her therapy session.  Client reported this particular female and her brother made a comment to her that she should kill herself.  Client reported her coping skills did not work and she called the crisis phone  number.  Client reported ultimately she was taken to the hospital.  Client reported coming out she now knows that she will not give this particular person another chance to be friend her.  Client reported they have tried to talk to her since she was hospitalized but she is not going to engage with them.  Client [facility]  Client reported she will look at the list of resources that I had provided for her to see if it is helpful for her to locate another program. Evidence of progress towards goal: Client reported 1 positive of better understanding the importance of following through with her boundaries.   Suicidal/Homicidal: Nowithout intent/plan  Therapist Response:  Therapist began the appointment asking the client how she has been doing since last seen. Therapist engaged with active listening and positive emotional support. Therapist used CBT to ask the client about her recent hospitalization. Therapist used CBT to empathize with the clients thoughts and emotions and continue to discussed with her the importance of utilizing positive boundaries and appropriate assertive communication. Therapist used CBT ask the client to identify her progress with frequency of use with coping skills with continued practice in her daily activity.    Therapist found client homework to practice self-care.   Plan: Return again in 4 weeks.  Diagnosis: mdd, recurrent, moderate  Collaboration of Care: Patient refused AEB none requested by the client.  Patient/Guardian was advised Release of Information must be obtained prior to any record release in order to collaborate their care with an outside provider. Patient/Guardian was advised if they have not already done so to contact the registration  department to sign all necessary forms in order for us  to release information regarding their care.   Consent: Patient/Guardian gives verbal consent for  treatment and assignment of benefits for services provided during this visit. Patient/Guardian expressed understanding and agreed to proceed.   Ranvir Renovato Y Haille Pardi, LCSW 07/08/2023

## 2023-07-15 ENCOUNTER — Ambulatory Visit: Admitting: Allergy

## 2023-07-23 ENCOUNTER — Other Ambulatory Visit: Payer: Self-pay | Admitting: Allergy

## 2023-07-30 ENCOUNTER — Encounter (HOSPITAL_COMMUNITY): Admitting: Student in an Organized Health Care Education/Training Program

## 2023-08-05 ENCOUNTER — Ambulatory Visit (INDEPENDENT_AMBULATORY_CARE_PROVIDER_SITE_OTHER): Admitting: Clinical

## 2023-08-05 DIAGNOSIS — F331 Major depressive disorder, recurrent, moderate: Secondary | ICD-10-CM | POA: Diagnosis not present

## 2023-08-05 NOTE — Progress Notes (Signed)
   THERAPIST PROGRESS NOTE Virtual Visit via Video Note  I connected with Ariel Hernandez on 08/05/23 at  2:00 PM EDT by a video enabled telemedicine application and verified that I am speaking with the correct person using two identifiers.  Location: Patient: home Provider: [facility] office      I discussed the limitations of evaluation and management by telemedicine and the availability of in person appointments. The patient expressed understanding and agreed to proceed.   Follow Up Instructions: I discussed the assessment and treatment plan with the patient. The patient was provided an opportunity to ask questions and all were answered. The patient agreed with the plan and demonstrated an understanding of the instructions.   The patient was advised to call back or seek an in-person evaluation if the symptoms worsen or if the condition fails to improve as anticipated.   Session Time: 30 minutes  Participation Level: Active  Behavioral Response: CasualAlertAnxious  Type of Therapy: Individual Therapy  Treatment Goals addressed: Reduce overall depression score by a minimum of 25% on the Patient Health Questionnaire (PHQ-9) or the Montgomery-Asberg Depression Rating Scale (MADRS)   ProgressTowards Goals: Progressing  Interventions: CBT and Supportive  Summary:  Ariel Hernandez is a 23 y.o. female who presents for the scheduled appointment oriented x 5, appropriately dressed, and friendly.  Client denied hallucinations and delusions. Client reported on today a lot has been going on. Client reported [facility] to the issues with [facility] and bullying. Client reported most of her time she has been doing activities around the house. Client reported this is occasionally a comment about wanting her to be engaged with a program so she is not at home all day.  Client reported she was offended by her sister as if she would just go  [facility] with knowing everything that happened earlier. Client reported her sister is going to help her to make some phone calls to locate rehab and other places to see about what services she can get. Evidence of progress towards goal:  client reported medication compliance 7 days per week.  Suicidal/Homicidal: Nowithout intent/plan  Therapist Response:  Therapist began the appointment asking the client how she has been doing since last seen. Therapist engaged using active listening and positive emotional support. Therapist used cbt to engage and ask her about changes that have occurred. Therapist used cbt to normalize her emotions and teach her to utilize appropriate communication. Therapist used CBT ask the client to identify her progress with frequency of use with coping skills with continued practice in her daily activity.    Therapist assigned the client homework to practice self care.   Plan: Return again in 4 weeks.  Diagnosis: MDD, moderate  Collaboration of Care: Patient refused AEB none requested by the client.  Patient/Guardian was advised Release of Information must be obtained prior to any record release in order to collaborate their care with an outside provider. Patient/Guardian was advised if they have not already done so to contact the registration department to sign all necessary forms in order for us  to release information regarding their care.   Consent: Patient/Guardian gives verbal consent for treatment and assignment of benefits for services provided during this visit. Patient/Guardian expressed understanding and agreed to proceed.   Aminat Shelburne Y Deavon Podgorski, LCSW 08/05/2023

## 2023-08-12 ENCOUNTER — Ambulatory Visit: Admitting: Allergy

## 2023-08-15 ENCOUNTER — Other Ambulatory Visit: Payer: Self-pay | Admitting: Allergy

## 2023-08-19 ENCOUNTER — Ambulatory Visit (INDEPENDENT_AMBULATORY_CARE_PROVIDER_SITE_OTHER): Admitting: Clinical

## 2023-08-19 DIAGNOSIS — F331 Major depressive disorder, recurrent, moderate: Secondary | ICD-10-CM

## 2023-08-19 NOTE — Progress Notes (Signed)
 THERAPIST PROGRESS NOTE Virtual Visit via Video Note  I connected with Ariel Hernandez on 08/19/23 at  2:00 PM EDT by a video enabled telemedicine application and verified that I am speaking with the correct person using two identifiers.  Location: Patient: home Provider: office   I discussed the limitations of evaluation and management by telemedicine and the availability of in person appointments. The patient expressed understanding and agreed to proceed.  Follow Up Instructions: I discussed the assessment and treatment plan with the patient. The patient was provided an opportunity to ask questions and all were answered. The patient agreed with the plan and demonstrated an understanding of the instructions.   The patient was advised to call back or seek an in-person evaluation if the symptoms worsen or if the condition fails to improve as anticipated.   Session Time: 30 minutes  Participation Level: Active  Behavioral Response: CasualAlertAnxious  Type of Therapy: Individual Therapy  Treatment Goals addressed: Reduce overall depression score by a minimum of 25% on the Patient Health Questionnaire (PHQ-9) or the Montgomery-Asberg Depression Rating Scale (MADRS)   ProgressTowards Goals: Progressing  Interventions: CBT and Supportive  Summary:  Ariel Hernandez is a 23 y.o. female who presents for the scheduled appointment oriented x 5, appropriately dressed, and friendly.  Client denied hallucinations and delusions. Client reported on today a lot is been happening.  Client reported she spent a few days at her parents house within the past few weeks.  Client reported her mom needed some help cleaning up around the house.  Client reported it was a pretty okay visit but her parents had a pattern of behavior where in the absence of 1 another they talk ill about each other to her.  Client reported it feels overwhelming because she does not really know how to advise them or what she  should do about that.  Client reported otherwise it has been pretty frustrating because social services has also decreased her food stamps to where she now has to look for outside resources to get food.  Client reported she had a neighbor take her to a Clinical research associate.  Client reported she has not been able to get Medicaid transportation as they keep telling her for some reason they are unable to assist at this time.  Client reported she is having to cancel doctors appointments regarding her skin, asthma and allergies that she is having.  Client reported her sister does not make accommodations to take her to any other appointments for her to go to the store to get some necessities that she needs.  Client reported she is trying her best to make things work with what she has. Evidence of progress towards goal: Client reported 1 positive of being resourceful with community resources.   Suicidal/Homicidal: Nowithout intent/plan  Therapist Response:  Therapist began the appointment asking the client how she has been doing since last seen. Therapist engaged active listening and positive emotional support. Therapist used CBT to give the client time to express her thoughts and feelings about frustrations with family and other psychosocial stressors. Therapist used CBT to discuss some community resources. Therapist used CBT to positively encouraged the client to continue utilizing resources known to her. Therapist used CBT ask the client to identify her progress with frequency of use with coping skills with continued practice in her daily activity.    Therapist assigned client homework to practice self-care.  Plan: Return again in 4 weeks.  Diagnosis: mdd,recurrent, moderate  Collaboration of Care: Patient refused AEB none requested.  Patient/Guardian was advised Release of Information must be obtained prior to any record release in order to collaborate their care with an outside provider.  Patient/Guardian was advised if they have not already done so to contact the registration department to sign all necessary forms in order for us  to release information regarding their care.   Consent: Patient/Guardian gives verbal consent for treatment and assignment of benefits for services provided during this visit. Patient/Guardian expressed understanding and agreed to proceed.   Deletha Jaffee Y Jaret Coppedge, LCSW 08/19/2023

## 2023-09-10 ENCOUNTER — Ambulatory Visit (INDEPENDENT_AMBULATORY_CARE_PROVIDER_SITE_OTHER)

## 2023-09-10 ENCOUNTER — Ambulatory Visit (HOSPITAL_COMMUNITY): Admitting: Clinical

## 2023-09-10 ENCOUNTER — Telehealth: Payer: Self-pay | Admitting: Allergy

## 2023-09-10 DIAGNOSIS — L209 Atopic dermatitis, unspecified: Secondary | ICD-10-CM | POA: Diagnosis not present

## 2023-09-10 MED ORDER — DUPILUMAB 300 MG/2ML ~~LOC~~ SOSY
600.0000 mg | PREFILLED_SYRINGE | Freq: Once | SUBCUTANEOUS | Status: AC
  Administered 2023-09-10: 600 mg via SUBCUTANEOUS

## 2023-09-10 NOTE — Progress Notes (Signed)
 Immunotherapy   Patient Details  Name: Ariel Hernandez MRN: [id number] Date of Birth: [birth year]  09/10/2023  [name] restarted Dupixent  for eczema. Patient received a 600 mg loading dose and waited 15 minutes with no problems.   [name] 09/10/2023, 2:45 PM

## 2023-09-10 NOTE — Telephone Encounter (Signed)
 Pt spoke w/[facility] about refills on inhalers - [facility] advised to send back to [facility]

## 2023-09-17 ENCOUNTER — Encounter (HOSPITAL_COMMUNITY): Admitting: Student in an Organized Health Care Education/Training Program

## 2023-09-17 ENCOUNTER — Telehealth (HOSPITAL_COMMUNITY): Payer: Self-pay

## 2023-09-17 ENCOUNTER — Other Ambulatory Visit (HOSPITAL_COMMUNITY): Payer: Self-pay | Admitting: Psychiatry

## 2023-09-17 DIAGNOSIS — Z8659 Personal history of other mental and behavioral disorders: Secondary | ICD-10-CM

## 2023-09-17 DIAGNOSIS — F79 Unspecified intellectual disabilities: Secondary | ICD-10-CM

## 2023-09-17 DIAGNOSIS — F605 Obsessive-compulsive personality disorder: Secondary | ICD-10-CM

## 2023-09-17 DIAGNOSIS — F4310 Post-traumatic stress disorder, unspecified: Secondary | ICD-10-CM

## 2023-09-17 DIAGNOSIS — F411 Generalized anxiety disorder: Secondary | ICD-10-CM

## 2023-09-17 DIAGNOSIS — Z79899 Other long term (current) drug therapy: Secondary | ICD-10-CM

## 2023-09-17 DIAGNOSIS — F39 Unspecified mood [affective] disorder: Secondary | ICD-10-CM

## 2023-09-17 MED ORDER — LAMOTRIGINE 200 MG PO TABS: 200.0000 mg | ORAL_TABLET | Freq: Every day | ORAL | 1 refills | Status: DC

## 2023-09-17 MED ORDER — QUETIAPINE FUMARATE 300 MG PO TABS: 300.0000 mg | ORAL_TABLET | Freq: Every day | ORAL | 1 refills | Status: DC

## 2023-09-17 MED ORDER — FLUOXETINE HCL 40 MG PO CAPS: 40.0000 mg | ORAL_CAPSULE | Freq: Every morning | ORAL | 1 refills | Status: DC

## 2023-09-17 NOTE — Telephone Encounter (Signed)
 Hello,   Is there any way that patients meds can be bridged until 10/22/2023 with provider. Patient will be running out soon.   JNL

## 2023-09-18 ENCOUNTER — Other Ambulatory Visit: Payer: Self-pay | Admitting: *Deleted

## 2023-09-18 DIAGNOSIS — J4530 Mild persistent asthma, uncomplicated: Secondary | ICD-10-CM

## 2023-09-18 MED ORDER — ALBUTEROL SULFATE HFA 108 (90 BASE) MCG/ACT IN AERS: 2.0000 | INHALATION_SPRAY | RESPIRATORY_TRACT | 1 refills | Status: AC | PRN

## 2023-09-18 MED ORDER — BUDESONIDE-FORMOTEROL FUMARATE 160-4.5 MCG/ACT IN AERO: 2.0000 | INHALATION_SPRAY | Freq: Two times a day (BID) | RESPIRATORY_TRACT | 2 refills | Status: DC

## 2023-09-18 NOTE — Telephone Encounter (Signed)
 RX for Symbicort  and albuterol  sent.

## 2023-09-23 ENCOUNTER — Other Ambulatory Visit: Payer: Self-pay | Admitting: Pharmacy Technician

## 2023-09-23 ENCOUNTER — Other Ambulatory Visit: Payer: Self-pay

## 2023-09-23 NOTE — Progress Notes (Signed)
 Disenrolled; last filled 06/01/2023

## 2023-09-24 ENCOUNTER — Encounter: Payer: Self-pay | Admitting: Allergy

## 2023-09-24 ENCOUNTER — Ambulatory Visit (INDEPENDENT_AMBULATORY_CARE_PROVIDER_SITE_OTHER): Admitting: Allergy

## 2023-09-24 ENCOUNTER — Other Ambulatory Visit: Payer: Self-pay

## 2023-09-24 ENCOUNTER — Ambulatory Visit

## 2023-09-24 VITALS — BP 126/70 | HR 92 | Temp 98.7°F | Wt 152.0 lb

## 2023-09-24 DIAGNOSIS — H109 Unspecified conjunctivitis: Secondary | ICD-10-CM

## 2023-09-24 DIAGNOSIS — H1013 Acute atopic conjunctivitis, bilateral: Secondary | ICD-10-CM

## 2023-09-24 DIAGNOSIS — J310 Chronic rhinitis: Secondary | ICD-10-CM

## 2023-09-24 DIAGNOSIS — J4540 Moderate persistent asthma, uncomplicated: Secondary | ICD-10-CM

## 2023-09-24 DIAGNOSIS — T7800XD Anaphylactic reaction due to unspecified food, subsequent encounter: Secondary | ICD-10-CM

## 2023-09-24 DIAGNOSIS — L2089 Other atopic dermatitis: Secondary | ICD-10-CM

## 2023-09-24 MED ORDER — MONTELUKAST SODIUM 10 MG PO TABS: 10.0000 mg | ORAL_TABLET | Freq: Every day | ORAL | 5 refills | Status: AC

## 2023-09-24 MED ORDER — LEVOCETIRIZINE DIHYDROCHLORIDE 5 MG PO TABS: 5.0000 mg | ORAL_TABLET | Freq: Every evening | ORAL | 5 refills | Status: AC

## 2023-09-24 MED ORDER — FLUTICASONE PROPIONATE 50 MCG/ACT NA SUSP: 2.0000 | Freq: Every day | NASAL | 5 refills | Status: AC

## 2023-09-24 MED ORDER — OLOPATADINE HCL 0.2 % OP SOLN
1.0000 [drp] | Freq: Every day | OPHTHALMIC | 5 refills | Status: DC | PRN
Start: 2023-09-24 — End: 2023-12-09

## 2023-09-24 MED ORDER — TACROLIMUS 0.1 % EX OINT: TOPICAL_OINTMENT | Freq: Two times a day (BID) | CUTANEOUS | 5 refills | Status: AC | PRN

## 2023-09-24 NOTE — Patient Instructions (Addendum)
 Allergic rhinitis Severe allergy to dogs causing rhinorrhea, nasal congestion, sneezing, conjunctivitis, and asthma exacerbation. Allergy immunotherapy may be beneficial but requires asthma control first. - Continue Xyzal  5mg  daily at this time. - Continue Flonase  2 sprays each nostril daily for 1-2 weeks at a time before stopping once nasal congestion improves for maximum benefit - Can use Olopatadine  0.2% 1 drop each eye daily as needed - Consider allergy immunotherapy once asthma is controlled.  Asthma Asthma is better controlled.  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  - For next 2 weeks change to Breztri inhaler 2 puffs twice a day.  This is a stronger inhaler than Symbicort .   Let me know if you have less symptoms with exercise while on Breztri and if so will send a prescription for this.  - Hold Symbicort  while on Breztri - Continue Singulair  10mg  daily. - Have access to albuterol  inhaler 2 puffs every 4-6 hours as needed for cough/wheeze/shortness of breath/chest tightness.  May use 15-20 minutes prior to activity.   Monitor frequency of use.    Asthma control goals:  Full participation in all desired activities (may need albuterol  before activity) Albuterol  use two time or less a week on average (not counting use with activity) Cough interfering with sleep two time or less a month Oral steroids no more than once a year No hospitalizations  Eczema Eczema flares exacerbated by dog allergy, affecting neck, eyelids, forehead, and ears. Dupixent  is considered to improve eczema control by reducing the allergy cascade. - Use Protopic  ointment twice a day as needed for eczema flares.  This can be used anywhere on body.  This is a non-steroid ointment.  - Use Triamcinolone  ointment (for milder flares on body) and Clobetasol  ointment (for severe flares on body) - Daily moisturization after bathing with a scent-free and dye-free moisturizer - Continue Dupixent   every 2 week injections.  Will let Tammy know about the message of being un-enrolled and will get this fixed.   - For facial rash this is likely side effect of Dupixent  and want you to use Opzelura ointment twice a day to facial irritation.  Let me know if this is effective and will send prescription.  If not, plan B  is to use metrogel ointment for the face.    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 Reaction to coleslaw causing throat itchiness and discomfort. Milk causes nausea and stomach pain, but not lactose intolerant and tolerates cheese, yogurts, ice cream.  - Continue avoiding fish, shellfish and coleslaw in diet - Follow emergency action plan in case of allergic reaction and have access to Epipen  device 0.3mg     Follow-up in 4-6 months or sooner if needed

## 2023-09-24 NOTE — Progress Notes (Unsigned)
 Follow-up Note  RE: Ariel Hernandez MRN: [id number] DOB: Nov 21, 2000 Date of Office Visit: 09/24/2023   History of present illness: Ariel Hernandez is a 23 y.o. female presenting today for follow-up of eczema, asthma, food allergy, allergic rhinitis.  She was last seen in the office on 05/07/2023 by myself.  She presents today with her sister and niece and nephews. Discussed the use of AI scribe software for clinical note transcription with the patient, who gave verbal consent to proceed.  She has developed a facial rash primarily located under her eyes and on her cheeks, described as itchy and painful. Despite continuing her allergy medications and applying creams like Vaseline, the rash persists. Her family and roommate have noticed the rash, and she mentions that her skin has generally improved since starting Dupixent , except for the recent facial breakout. She is unable to use Eucrisa due to a burning sensation and worsening of symptoms when applied.  She experiences breathing difficulties during physical activity, particularly on longer walks. She uses her albuterol  inhaler before walks but often needs to use it two to three times during the activity. She is currently on Symbicort , taking two puffs twice a day. Her asthma symptoms are exacerbated by heat and physical exertion.  Her allergy symptoms, including runny nose, stuffy nose, itchy and watery eyes, and sneezing, have improved with the use of Xyzal  and a nasal spray. She has not had to use her EpiPen  since the last visit.       Review of systems: 10pt ROS negative unless noted above in HPI  Past medical/social/surgical/family history have been reviewed and are unchanged unless specifically indicated below.  No changes  Medication List: Current Outpatient Medications  Medication Sig Dispense Refill   albuterol  (PROAIR  HFA) 108 (90 Base) MCG/ACT inhaler Inhale 2 puffs into the lungs every 4 (four) hours as needed (cough). 1  each 1   ALTAVERA 0.15-30 MG-MCG tablet Take 1 tablet by mouth daily.     budesonide -formoterol  (SYMBICORT ) 160-4.5 MCG/ACT inhaler Inhale 2 puffs into the lungs 2 (two) times daily. 1 each 2   busPIRone  (BUSPAR ) 30 MG tablet Take 30 mg by mouth 2 (two) times daily.     Dupilumab  (DUPIXENT ) 300 MG/2ML SOAJ Inject 300 mg into the skin every 14 (fourteen) days.     EPINEPHrine  0.3 mg/0.3 mL IJ SOAJ injection Inject 0.3 mg into the muscle as needed for anaphylaxis. 2 each 1   FLUoxetine  (PROZAC ) 40 MG capsule Take 1 capsule (40 mg total) by mouth every morning. 30 capsule 1   fluticasone  (FLONASE ) 50 MCG/ACT nasal spray SPRAY 2 SPRAYS INTO EACH NOSTRIL EVERY DAY 48 mL 2   lamoTRIgine  (LAMICTAL ) 200 MG tablet Take 1 tablet (200 mg total) by mouth at bedtime. 30 tablet 1   levocetirizine (XYZAL ) 5 MG tablet TAKE 1 TABLET BY MOUTH EVERY DAY IN THE EVENING 90 tablet 1   montelukast  (SINGULAIR ) 10 MG tablet Take 1 tablet (10 mg total) by mouth at bedtime. 30 tablet 11   QUEtiapine  (SEROQUEL ) 300 MG tablet Take 1 tablet (300 mg total) by mouth at bedtime. 30 tablet 1   triamcinolone  cream (KENALOG ) 0.1 % Apply 1 Application topically 2 (two) times daily.     Current Facility-Administered Medications  Medication Dose Route Frequency Provider Last Rate Last Admin   dupilumab  (DUPIXENT ) prefilled syringe 300 mg  300 mg Subcutaneous Q14 Days Frances Ambrosino Patricia, MD   300 mg at 06/04/23 1353     Known  medication allergies: Allergies  Allergen Reactions   Dexmethylphenidate Hcl Shortness Of Breath   Fish-Derived Products Shortness Of Breath   Grass Pollen(K-O-R-T-Swt Vern) Itching and Other (See Comments)    Animal dander   Hydrocortisone Rash   Focalin [Dexmethylphenidate] Other (See Comments)    Reaction unknown   Bee Pollen Itching and Rash   Milk (Cow) Diarrhea, Nausea And Vomiting and Palpitations    Physical examination: Blood pressure 126/70, pulse 92, temperature 98.7 F (37.1 C),  temperature source Temporal, weight 152 lb (68.9 kg), SpO2 97%.  General: Alert, interactive, in no acute distress. HEENT: PERRLA, TMs pearly gray, turbinates minimally edematous without discharge, post-pharynx non erythematous. Neck: Supple without lymphadenopathy. Lungs: Clear to auscultation without wheezing, rhonchi or rales. {no increased work of breathing. CV: Normal S1, S2 without murmurs. Abdomen: Nondistended, nontender. Skin: Mildly erythematous patch across the cheeks with more erythematous papules around the nasolabial fold. Extremities:  No clubbing, cyanosis or edema. Neuro:   Grossly intact.  Diagnostics/Labs:  Spirometry: FEV1: 3.0L 120%, FVC: 3.56L 126%, ratio consistent with nonobstructive pattern  Assessment and plan:   Allergic rhinitis Severe allergy to dogs causing rhinorrhea, nasal congestion, sneezing, conjunctivitis, and asthma exacerbation. Allergy immunotherapy may be beneficial but requires asthma control first. - Continue Xyzal  5mg  daily at this time. - Continue Flonase  2 sprays each nostril daily for 1-2 weeks at a time before stopping once nasal congestion improves for maximum benefit - Can use Olopatadine  0.2% 1 drop each eye daily as needed - Consider allergy immunotherapy once asthma is controlled.  Asthma Asthma is better controlled.  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  - For next 2 weeks change to Breztri inhaler 2 puffs twice a day.  This is a stronger inhaler than Symbicort .   Let me know if you have less symptoms with exercise while on Breztri and if so will send a prescription for this.  - Hold Symbicort  while on Breztri - Continue Singulair  10mg  daily. - Have access to albuterol  inhaler 2 puffs every 4-6 hours as needed for cough/wheeze/shortness of breath/chest tightness.  May use 15-20 minutes prior to activity.   Monitor frequency of use.    Asthma control goals:  Full participation in all desired  activities (may need albuterol  before activity) Albuterol  use two time or less a week on average (not counting use with activity) Cough interfering with sleep two time or less a month Oral steroids no more than once a year No hospitalizations  Eczema Eczema flares exacerbated by dog allergy, affecting neck, eyelids, forehead, and ears. Dupixent  is considered to improve eczema control by reducing the allergy cascade. - Use Protopic  ointment twice a day as needed for eczema flares.  This can be used anywhere on body.  This is a non-steroid ointment.  - Use Triamcinolone  ointment (for milder flares on body) and Clobetasol  ointment (for severe flares on body) - Daily moisturization after bathing with a scent-free and dye-free moisturizer - Continue Dupixent  every 2 week injections.  Will let Tammy know about the message of being un-enrolled and will get this fixed.   - For facial rash this is likely side effect of Dupixent  and want you to use Opzelura ointment twice a day to facial irritation.  Let me know if this is effective and will send prescription.  If not, plan B  is to use metrogel ointment for the face.    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  Reaction to coleslaw causing throat itchiness and discomfort. Milk causes nausea and stomach pain, but not lactose intolerant and tolerates cheese, yogurts, ice cream.  - Continue avoiding fish, shellfish and coleslaw in diet - Follow emergency action plan in case of allergic reaction and have access to Epipen  device 0.3mg     Follow-up in 4-6 months or sooner if needed   I appreciate the opportunity to take part in Saralyn's care. Please do not hesitate to contact me with questions.  Sincerely,   Danita Brain, MD Allergy/Immunology [facility]

## 2023-09-25 ENCOUNTER — Ambulatory Visit (HOSPITAL_COMMUNITY): Admitting: Clinical

## 2023-09-28 ENCOUNTER — Telehealth: Payer: Self-pay | Admitting: *Deleted

## 2023-09-28 ENCOUNTER — Other Ambulatory Visit: Payer: Self-pay | Admitting: Pharmacy Technician

## 2023-09-28 ENCOUNTER — Other Ambulatory Visit: Payer: Self-pay

## 2023-09-28 MED ORDER — DUPIXENT 300 MG/2ML ~~LOC~~ SOSY
300.0000 mg | PREFILLED_SYRINGE | SUBCUTANEOUS | 11 refills | Status: AC
  Filled 2023-09-28 – 2023-09-30 (×2): qty 4, 28d supply, fill #0
  Filled 2023-10-30: qty 4, 28d supply, fill #1
  Filled 2023-11-30 – 2023-12-01 (×2): qty 4, 28d supply, fill #2
  Filled 2023-12-28: qty 4, 28d supply, fill #3

## 2023-09-28 NOTE — Telephone Encounter (Signed)
-----   Message from [facility] sent at 09/24/2023  7:20 PM EDT ----- Pt showed me a written message given by the shot room that both she and I are confused by.   Per message pt has been un-enrolled as of 9/3 for dupixent .   Do you know what this means and can you re-enroll as we are continuing on drug.

## 2023-09-28 NOTE — Telephone Encounter (Signed)
 Called patient and advised [employer] had disenrolled her due to no reponse from their calls, will resend rx and have gotten the repproval. Number given to patient to reach out to pharmacy

## 2023-09-28 NOTE — Progress Notes (Signed)
 9.8: Per Juliana via Teams message to profile RX for Dupixent .  Vick have reached out to [facility] about it and patient have yet to respond to our refill requests.   Accidentally canceled when I meant to profile RX.

## 2023-09-30 ENCOUNTER — Other Ambulatory Visit: Payer: Self-pay

## 2023-09-30 ENCOUNTER — Other Ambulatory Visit (HOSPITAL_COMMUNITY): Payer: Self-pay

## 2023-09-30 NOTE — Progress Notes (Signed)
 Initial fill has been completed in [place] and queued in basket.

## 2023-09-30 NOTE — Progress Notes (Signed)
 Specialty Pharmacy Initial Fill Coordination Note  Ariel Hernandez is a 23 y.o. female contacted today regarding initial fill of specialty medication(s) Dupilumab  (Dupixent )   Patient requested No data recorded  Delivery date: 10/06/23   Verified address: [address] [place] [address] [place] [address]   Medication will be filled on 09/15.   Patient is aware of $0.00 copayment.

## 2023-09-30 NOTE — Progress Notes (Signed)
 Specialty Pharmacy Initiation Note   Ariel Hernandez is a 23 y.o. female who will be followed by the specialty pharmacy service for RxSp Atopic Dermatitis    Review of administration, indication, effectiveness, safety, potential side effects, storage/disposable, and missed dose instructions occurred today for patient's specialty medication(s) Dupilumab  (Dupixent )     Patient/Caregiver did not have any additional questions or concerns.   Patient's therapy is appropriate to: Initiate    Goals Addressed             This Visit's Progress    Minimize recurrence of flares       Patient is re-initiating therapy. Patient will maintain adherence.         Krishay Faro M Dyana Magner Specialty Pharmacist

## 2023-10-05 ENCOUNTER — Other Ambulatory Visit: Payer: Self-pay

## 2023-10-09 ENCOUNTER — Ambulatory Visit

## 2023-10-16 ENCOUNTER — Ambulatory Visit

## 2023-10-16 ENCOUNTER — Telehealth: Payer: Self-pay | Admitting: Allergy

## 2023-10-16 DIAGNOSIS — L2089 Other atopic dermatitis: Secondary | ICD-10-CM
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
## 2023-10-19 ENCOUNTER — Ambulatory Visit (INDEPENDENT_AMBULATORY_CARE_PROVIDER_SITE_OTHER): Admitting: Clinical

## 2023-10-19 DIAGNOSIS — F411 Generalized anxiety disorder: Secondary | ICD-10-CM | POA: Diagnosis not present

## 2023-10-19 MED ORDER — BREZTRI AEROSPHERE 160-9-4.8 MCG/ACT IN AERO: 2.0000 | INHALATION_SPRAY | Freq: Two times a day (BID) | RESPIRATORY_TRACT | 5 refills | Status: DC

## 2023-10-19 NOTE — Telephone Encounter (Signed)
 I called the patient and left a message to call the office back. [name] has been sent into the pharmacy.

## 2023-10-19 NOTE — Addendum Note (Signed)
 Addended by: MENDEZ-MUNGARAY, Nevaeh Casillas M on: 10/19/2023 09:32 AM   Modules accepted: Orders

## 2023-10-21 ENCOUNTER — Encounter (HOSPITAL_COMMUNITY): Payer: Self-pay

## 2023-10-22 ENCOUNTER — Encounter (HOSPITAL_COMMUNITY): Payer: Self-pay

## 2023-10-22 ENCOUNTER — Telehealth (INDEPENDENT_AMBULATORY_CARE_PROVIDER_SITE_OTHER): Admitting: Student in an Organized Health Care Education/Training Program

## 2023-10-22 DIAGNOSIS — F605 Obsessive-compulsive personality disorder: Secondary | ICD-10-CM

## 2023-10-22 DIAGNOSIS — Z79899 Other long term (current) drug therapy: Secondary | ICD-10-CM

## 2023-10-22 DIAGNOSIS — F79 Unspecified intellectual disabilities: Secondary | ICD-10-CM

## 2023-10-22 DIAGNOSIS — F4310 Post-traumatic stress disorder, unspecified: Secondary | ICD-10-CM

## 2023-10-22 DIAGNOSIS — Z8659 Personal history of other mental and behavioral disorders: Secondary | ICD-10-CM

## 2023-10-22 DIAGNOSIS — F39 Unspecified mood [affective] disorder: Secondary | ICD-10-CM | POA: Diagnosis not present

## 2023-10-22 DIAGNOSIS — F411 Generalized anxiety disorder: Secondary | ICD-10-CM | POA: Diagnosis not present

## 2023-10-22 MED ORDER — QUETIAPINE FUMARATE 300 MG PO TABS: 300.0000 mg | ORAL_TABLET | Freq: Every day | ORAL | 2 refills | Status: DC

## 2023-10-22 MED ORDER — PRAZOSIN HCL 1 MG PO CAPS: 1.0000 mg | ORAL_CAPSULE | Freq: Every day | ORAL | 2 refills | Status: DC

## 2023-10-22 MED ORDER — BUSPIRONE HCL 30 MG PO TABS: 30.0000 mg | ORAL_TABLET | Freq: Two times a day (BID) | ORAL | 2 refills | Status: AC

## 2023-10-22 MED ORDER — FLUOXETINE HCL 40 MG PO CAPS: 40.0000 mg | ORAL_CAPSULE | Freq: Every morning | ORAL | 2 refills | Status: DC

## 2023-10-22 MED ORDER — LAMOTRIGINE 200 MG PO TABS: 200.0000 mg | ORAL_TABLET | Freq: Every day | ORAL | 2 refills | Status: AC
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
## 2023-10-23 ENCOUNTER — Telehealth (HOSPITAL_COMMUNITY): Payer: Self-pay | Admitting: Student in an Organized Health Care Education/Training Program

## 2023-10-23 NOTE — Telephone Encounter (Signed)
 Contacted and spoke with the patient at [phone] to discuss appointment availability.  The patient reports ongoing difficulties arranging transportation for an in person appointment at this time, request that arranging the appointment be deferred for a later date.  Patient advised to contact our clinic to arrange an appointment when able.  Chazlyn Cude Carrin Carrero, MD PGY-3, [facility]

## 2023-10-30 ENCOUNTER — Other Ambulatory Visit: Payer: Self-pay | Admitting: Internal Medicine

## 2023-10-30 ENCOUNTER — Other Ambulatory Visit: Payer: Self-pay

## 2023-10-30 ENCOUNTER — Telehealth: Payer: Self-pay

## 2023-10-30 ENCOUNTER — Ambulatory Visit

## 2023-10-30 DIAGNOSIS — L2089 Other atopic dermatitis: Secondary | ICD-10-CM | POA: Diagnosis not present

## 2023-10-30 MED ORDER — OPZELURA 1.5 % EX CREA: TOPICAL_CREAM | CUTANEOUS | 5 refills | Status: AC

## 2023-10-30 NOTE — Progress Notes (Signed)
 Opzelura Rx sent.

## 2023-10-30 NOTE — Progress Notes (Signed)
 Specialty Pharmacy Refill Coordination Note  Ariel Hernandez is a 23 y.o. female contacted today regarding refills of specialty medication(s) Dupilumab  (Dupixent )   Patient requested Courier to Provider Office   Delivery date: 11/10/23   Verified address: [address] [place] [address]   Medication will be filled on 11/09/23.

## 2023-10-30 NOTE — Telephone Encounter (Signed)
 Patient came in for AIT and asked for a prescription of Opselura ointment. She reports that it was doing well for her.

## 2023-11-02 ENCOUNTER — Other Ambulatory Visit: Payer: Self-pay | Admitting: Allergy

## 2023-11-02 ENCOUNTER — Telehealth: Payer: Self-pay

## 2023-11-02 NOTE — Addendum Note (Signed)
 Addended by: MARCINE ISAIAH CROME on: 11/02/2023 02:26 PM   Modules accepted: Orders

## 2023-11-02 NOTE — Telephone Encounter (Signed)
 Ariel Hernandez called and stated that she received a notification from the pharmacy that Opzelura was sent in for her, but that she no longer needs this medication as she had a bad reaction to it. She states what she is in need of is her budesonide -glycopyrrolate-formoterol  (BREZTRI AEROSPHERE) 160-9-4.8 MCG/ACT AERO inhaler [[id number]]

## 2023-11-02 NOTE — Telephone Encounter (Signed)
*  AA  Pharmacy Patient Advocate Encounter   Received notification from CoverMyMeds that prior authorization for Opzelura 1.5% is required/requested.   Insurance verification completed.   The patient is insured through [facility].   Per test claim: PA required; PA submitted to above mentioned insurance via Latent Key/confirmation #/EOC [id number] Status is pending

## 2023-11-02 NOTE — Telephone Encounter (Signed)
 Pharmacy Patient Advocate Encounter  Received notification from [facility] that Prior Authorization for Opzelura 1.5% has been APPROVED from 11/02/2023 to 11/01/2024

## 2023-11-02 NOTE — Telephone Encounter (Signed)
 Left message for patient

## 2023-11-04 MED ORDER — BREZTRI AEROSPHERE 160-9-4.8 MCG/ACT IN AERO: INHALATION_SPRAY | RESPIRATORY_TRACT | 5 refills | Status: DC

## 2023-11-09 ENCOUNTER — Other Ambulatory Visit: Payer: Self-pay

## 2023-11-13 ENCOUNTER — Ambulatory Visit

## 2023-11-13 DIAGNOSIS — L2089 Other atopic dermatitis: Secondary | ICD-10-CM

## 2023-11-15 ENCOUNTER — Other Ambulatory Visit (HOSPITAL_COMMUNITY): Payer: Self-pay | Admitting: Student in an Organized Health Care Education/Training Program

## 2023-11-15 DIAGNOSIS — F79 Unspecified intellectual disabilities: Secondary | ICD-10-CM

## 2023-11-15 DIAGNOSIS — Z79899 Other long term (current) drug therapy: Secondary | ICD-10-CM

## 2023-11-15 DIAGNOSIS — F411 Generalized anxiety disorder: Secondary | ICD-10-CM

## 2023-11-15 DIAGNOSIS — F4310 Post-traumatic stress disorder, unspecified: Secondary | ICD-10-CM

## 2023-11-15 DIAGNOSIS — F605 Obsessive-compulsive personality disorder: Secondary | ICD-10-CM

## 2023-11-15 DIAGNOSIS — F39 Unspecified mood [affective] disorder: Secondary | ICD-10-CM

## 2023-11-20 NOTE — Progress Notes (Signed)
   THERAPIST PROGRESS NOTE Virtual Visit via Video Note  I connected with Ariel Hernandez on 10/19/2023 at  2:00 PM EDT by a video enabled telemedicine application and verified that I am speaking with the correct person using two identifiers.  Location: Patient: home Provider: office   I discussed the limitations of evaluation and management by telemedicine and the availability of in person appointments. The patient expressed understanding and agreed to proceed.   Follow Up Instructions: I discussed the assessment and treatment plan with the patient. The patient was provided an opportunity to ask questions and all were answered. The patient agreed with the plan and demonstrated an understanding of the instructions.   The patient was advised to call back or seek an in-person evaluation if the symptoms worsen or if the condition fails to improve as anticipated.    Session Time: 25 min  Participation Level: Active  Behavioral Response: CasualAlertEuthymic  Type of Therapy: Individual Therapy  Treatment Goals addressed: client will engage in at least 80% of scheduled individual psychotherapy sessions  ProgressTowards Goals: Progressing  Interventions: CBT and Supportive  Summary:  Ariel Hernandez is a 23 y.o. female who presents for the scheduled appointment oriented times five, appropriately dressed and friendly. Client denied hallucinations and delusions. Client reported she is doing pretty okay. Client reported she has been with her parents the past week. Client reported while there her father had a medical emergency and he was found on the floor and bleeding. Client reported he had his foot amputated. Client reported she has been having nightmares while she is away from home. Client reported she doesn't know why. Client reported her mother said it could be because she maybe feels nervous for being away from her room but unsure. Client reported overall she is feeling encouraged  because her relationship with her parents seems to be getting better. Client reported she is still having issues with transportation with medicaid. Evidence of progress towards goal:  client reported medication compliance 7 days per week.  Suicidal/Homicidal: Nowithout intent/plan  Therapist Response:  Therapist began the appointment asking how she has been doing. Therapist engaged with active listening and positive emotional support. Therapist used cbt to engage and give her time to discuss her thoughts and feelings about family and psychosocial stressors. Therapist used cbt to normalize how she feels and discuss steps to  contact persons to help with transportation through insurance or dss. Therapist used CBT ask the client to identify her progress with frequency of use with coping skills with continued practice in her daily activity.    Therapist assigned homework to contact persons suggested with her insurance to help with transportation to her doctor appointments.   Plan: Return again in 4 weeks.  Diagnosis: GAD  Collaboration of Care: Patient refused AEB none requested by the client.  Patient/Guardian was advised Release of Information must be obtained prior to any record release in order to collaborate their care with an outside provider. Patient/Guardian was advised if they have not already done so to contact the registration department to sign all necessary forms in order for us  to release information regarding their care.   Consent: Patient/Guardian gives verbal consent for treatment and assignment of benefits for services provided during this visit. Patient/Guardian expressed understanding and agreed to proceed.   Azula Zappia Y Aryia Delira, LCSW 10/19/2023

## 2023-11-25 ENCOUNTER — Ambulatory Visit

## 2023-11-25 DIAGNOSIS — L2089 Other atopic dermatitis: Secondary | ICD-10-CM

## 2023-11-29 NOTE — Patient Instructions (Incomplete)
 Asthma Continue montelukast  10 mg once a day to prevent cough or wheeze Continue albuterol  2 puffs once every 4 hours if needed for cough or wheeze You may use albuterol  2 puffs 5-15 minutes before activity to decrease cough or wheeze   Allergic rhinitis Ontinue allergen avoidance measures directed toward pollen, mold, dust mite, cat, and dog as listed below Continue Xyzal  5 mg once a day if needed for runny nose or itch Continue Flonase  2 sprays in each nostril once a day if needed for stuffy nose.aasal Consider allergen immunotherapy once your asthma is well-controlled  Allergic conjunctivitis Some over the counter eye drops include Pataday  one drop in each eye once a day as needed for red, itchy eyes OR Zaditor one drop in each eye twice a day as needed for red itchy eyes. Avoid eye drops that say red eye relief as they may contain medications that dry out your eyes.   Atopic dermatitis Continue a twice a day moisturizing routine Continue Protopic  to reddened itchy areas up to twice a day if needed Continue triamcinolone  to stubborn red itchy areas under [place] your face up to twice a day if needed.  Do not use this medication for longer than 2 weeks in a row For really stubborn areas underneath your face, use clobetasol  once or twice a day if needed.  Do not use this medication longer than 10 days in a row Continue Dupixent  300 mg once every 2 weeks for control of atopic dermatitis  Rash Continue Opzulera twice a day if needed to red and itchy areas on your face If your symptoms re-occur, begin a journal of events that occurred for up to 6 hours before your symptoms began including foods and beverages consumed, soaps or perfumes you had contact with, and medications.   Food allergy Continue to avoid fish, shellfish, and coleslaw.  In case of an allergic reaction, take cetirizine  10 mg once every 12-24  hours, and if life-threatening symptoms occur, inject with EpiPen  0.3 mg. Consider  skin testing to shellfish and possibly a food challenge if skin testing is negative once your breathing has returned to baseline  Call the clinic if this treatment plan is not working well for you.  Follow up in *** or sooner if needed.  Reducing Pollen Exposure The American Academy of Allergy, Asthma and Immunology suggests the following steps to reduce your exposure to pollen during allergy seasons. Do not hang sheets or clothing out to dry; pollen may collect on these items. Do not mow lawns or spend time around freshly cut grass; mowing stirs up pollen. Keep windows closed at night.  Keep car windows closed while driving. Minimize morning activities outdoors, a time when pollen counts are usually at their highest. Stay indoors as much as possible when pollen counts or humidity is high and on windy days when pollen tends to remain in the air longer. Use air conditioning when possible.  Many air conditioners have filters that trap the pollen spores. Use a HEPA room air filter to remove pollen form the indoor air you breathe.  Control of Dog or Cat Allergen Avoidance is the best way to manage a dog or cat allergy. If you have a dog or cat and are allergic to dog or cats, consider removing the dog or cat from the home. If you have a dog or cat but don't want to find it a new home, or if your family wants a pet even though someone in the household is  allergic, here are some strategies that may help keep symptoms at bay:  Keep the pet out of your bedroom and restrict it to only a few rooms. Be advised that keeping the dog or cat in only one room will not limit the allergens to that room. Don't pet, hug or kiss the dog or cat; if you do, wash your hands with soap and water . High-efficiency particulate air (HEPA) cleaners run continuously in a bedroom or living room can reduce allergen levels over time. Regular use of a high-efficiency vacuum cleaner or a central vacuum can reduce allergen  levels. Giving your dog or cat a bath at least once a week can reduce airborne allergen.  Control of Mold Allergen Mold and fungi can grow on a variety of surfaces provided certain temperature and moisture conditions exist.  Outdoor molds grow on plants, decaying vegetation and soil.  The major outdoor mold, Alternaria and Cladosporium, are found in very high numbers during hot and dry conditions.  Generally, a late Summer - Fall peak is seen for common outdoor fungal spores.  Rain will temporarily lower outdoor mold spore count, but counts rise rapidly when the rainy period ends.  The most important indoor molds are Aspergillus and Penicillium.  Dark, humid and poorly ventilated basements are ideal sites for mold growth.  The next most common sites of mold growth are the bathroom and the kitchen.  Outdoor [employer] Use air conditioning and keep windows closed Avoid exposure to decaying vegetation. Avoid leaf raking. Avoid grain handling. Consider wearing a face mask if working in moldy areas.  Indoor Mold Control Maintain humidity below 50%. Clean washable surfaces with 5% bleach solution. Remove sources e.g. Contaminated carpets.   Control of Dust Mite Allergen Dust mites play a major role in allergic asthma and rhinitis. They occur in environments with high humidity wherever human skin is found. Dust mites absorb humidity from the atmosphere (ie, they do not drink) and feed on organic matter (including shed human and animal skin). Dust mites are a microscopic type of insect that you cannot see with the naked eye. High levels of dust mites have been detected from mattresses, pillows, carpets, upholstered furniture, bed covers, clothes, soft toys and any woven material. The principal allergen of the dust mite is found in its feces. A gram of dust may contain 1,000 mites and 250,000 fecal particles. Mite antigen is easily measured in the air during house cleaning activities. Dust mites do not  bite and do not cause harm to humans, other than by triggering allergies/asthma.  Ways to decrease your exposure to dust mites in your home:  1. Encase mattresses, box springs and pillows with a mite-impermeable barrier or cover  2. Wash sheets, blankets and drapes weekly in hot water  (130 F) with detergent and dry them in a dryer on the hot setting.  3. Have the room cleaned frequently with a vacuum cleaner and a damp dust-mop. For carpeting or rugs, vacuuming with a vacuum cleaner equipped with a high-efficiency particulate air (HEPA) filter. The dust mite allergic individual should not be in a room which is being cleaned and should wait 1 hour after cleaning before going into the room.  4. Do not sleep on upholstered furniture (eg, couches).  5. If possible removing carpeting, upholstered furniture and drapery from the home is ideal. Horizontal blinds should be eliminated in the rooms where the person spends the most time (bedroom, study, television room). Washable vinyl, roller-type shades are optimal.  6.  Remove all non-washable stuffed toys from the bedroom. Wash stuffed toys weekly like sheets and blankets above.  7. Reduce indoor humidity to less than 50%. Inexpensive humidity monitors can be purchased at most hardware stores. Do not use a humidifier as can make the problem worse and are not recommended.

## 2023-11-29 NOTE — Progress Notes (Deleted)
   [address] [place] [address] Dept: [phone]  FOLLOW UP NOTE  Patient ID: Ariel Hernandez, female    DOB: 08-28-2000  Age: 23 y.o. MRN: [id number] Date of Office Visit: 11/30/2023  Assessment  Chief Complaint: No chief complaint on file.  HPI Ariel Hernandez is a 23 year old female who presents to the clinic for follow-up visit.  She was last seen in this clinic on 09/24/2023 by Dr. Jeneal for evaluation of asthma, allergic rhinitis, atopic dermatitis, rash, and food allergy to fish, shellfish, and coleslaw.  Her last environmental allergy testing via lab on 10/01/2023 was positive to grass pollen, ragweed pollen, tree pollen, mold, dust mite, cat, dog, and mouse.  Her last food allergy testing via lab on 10/01/2023 was positive to fish and levels to shellfish were very low.  She was offered the possibility of a food challenge to shellfish at that time. Discussed the use of AI scribe software for clinical note transcription with the patient, who gave verbal consent to proceed.  History of Present Illness      Drug Allergies:  Allergies  Allergen Reactions   Dexmethylphenidate Hcl Shortness Of Breath   Fish Protein-Containing Drug Products Shortness Of Breath   Grass Pollen(K-O-R-T-Swt Vern) Itching and Other (See Comments)    Animal dander   Hydrocortisone Rash   Focalin [Dexmethylphenidate] Other (See Comments)    Reaction unknown   Bee Pollen Itching and Rash   Milk (Cow) Diarrhea, Nausea And Vomiting and Palpitations    Physical Exam: There were no vitals taken for this visit.   Physical Exam  Diagnostics:    Assessment and Plan: No diagnosis found.  No orders of the defined types were placed in this encounter.   There are no Patient Instructions on file for this visit.  No follow-ups on file.    Thank you for the opportunity to care for this patient.  Please do not hesitate to contact me with questions.  Arlean Mutter, FNP [facility]ergy [facility]

## 2023-11-30 ENCOUNTER — Ambulatory Visit: Admitting: Family Medicine

## 2023-11-30 ENCOUNTER — Other Ambulatory Visit: Payer: Self-pay

## 2023-12-01 ENCOUNTER — Other Ambulatory Visit: Payer: Self-pay

## 2023-12-01 NOTE — Progress Notes (Signed)
 Specialty Pharmacy Refill Coordination Note  Ariel Hernandez is a 23 y.o. female assessed today regarding refills of clinic administered specialty medication(s) Dupilumab  (Dupixent )   Clinic requested Courier to Provider Office   Delivery date: 12/03/23   Verified address: [address] [place] [address]   Medication will be filled on: 12/02/23   Appointment 12/09/23.

## 2023-12-02 ENCOUNTER — Other Ambulatory Visit: Payer: Self-pay

## 2023-12-03 ENCOUNTER — Encounter (HOSPITAL_COMMUNITY): Admitting: Student in an Organized Health Care Education/Training Program

## 2023-12-03 ENCOUNTER — Telehealth (HOSPITAL_COMMUNITY): Payer: Self-pay | Admitting: Student in an Organized Health Care Education/Training Program

## 2023-12-03 NOTE — Telephone Encounter (Signed)
 Patient did not show up for the appointment.  Appointment reminders were sent to patient's phone and email.  Attempted to call the patient at her mobile number but received no response. Left a HIPAA compliant voicemail for patient to reschedule.    Kikue Gerhart Carrin Carrero, MD PGY-3, [facility]

## 2023-12-08 NOTE — Patient Instructions (Incomplete)
 Asthma- insurance does not cover [employer]. Does not do well with dry powder inhalers Start Symbicort  160/4.5 mcg 2 puffs twice a day with spacer to help prevent cough and wheeze. Rinse mouth out after Start Spiriva Respimat 1.25 mcg 2 puffs once a day to help patient cough and wheeze.  Demonstration given Continue montelukast  10 mg once a day to prevent cough or wheeze Continue albuterol  2 puffs once every 4 hours if needed for cough or wheeze You may use albuterol  2 puffs 5-15 minutes before activity to decrease cough or wheeze   Allergic rhinitis Ontinue allergen avoidance measures directed toward pollen, mold, dust mite, cat, and dog as listed below Continue Xyzal  5 mg once a day if needed for runny nose or itch Continue Flonase  2 sprays in each nostril once a day if needed for stuffy nose.aasal Consider allergen immunotherapy once your asthma is well-controlled Stop Benadryl  daily due to increased risk of dementia. Use Pataday  (olopatadine ) as below for itchy eyes  Allergic conjunctivitis Start Pataday  (olopatadine ) 0.2% using 1 drop in each eye once a day as needed for itchy watery eyes  Atopic dermatitis Continue a twice a day moisturizing routine Continue Protopic  to reddened itchy areas up to twice a day if needed Continue triamcinolone  to stubborn red itchy areas under [place] your face up to twice a day if needed.  Do not use this medication for longer than 2 weeks in a row For really stubborn areas underneath your face, use clobetasol  once or twice a day if needed.  Do not use this medication longer than 10 days in a row Continue Dupixent  300 mg once every 2 weeks for control of atopic dermatitis  Rash Stop Opzulera due to reaction Called and spoke with Ariel Hernandez about starting mupirocin  2% ointment using 1 application twice a day for 7 days to the area under her nose. If your symptoms re-occur, begin a journal of events that occurred for up to 6 hours before your symptoms began  including foods and beverages consumed, soaps or perfumes you had contact with, and medications.   Food allergy Continue to avoid fish, shellfish, and coleslaw.  In case of an allergic reaction, take cetirizine  10 mg once every 12-24  hours, and if life-threatening symptoms occur, inject with EpiPen  0.3 mg. Consider skin testing to shellfish and possibly a food challenge if skin testing is negative once your breathing has returned to baseline  If your sore throat persist recommend going to urgent care where they can check for strep throat. Call the clinic if this treatment plan is not working well for you.  Follow up in 4 to 6 weeks or sooner if needed.  Reducing Pollen Exposure The American Academy of Allergy, Asthma and Immunology suggests the following steps to reduce your exposure to pollen during allergy seasons. Do not hang sheets or clothing out to dry; pollen may collect on these items. Do not mow lawns or spend time around freshly cut grass; mowing stirs up pollen. Keep windows closed at night.  Keep car windows closed while driving. Minimize morning activities outdoors, a time when pollen counts are usually at their highest. Stay indoors as much as possible when pollen counts or humidity is high and on windy days when pollen tends to remain in the air longer. Use air conditioning when possible.  Many air conditioners have filters that trap the pollen spores. Use a HEPA room air filter to remove pollen form the indoor air you breathe.  Control of Dog or  Cat Allergen Avoidance is the best way to manage a dog or cat allergy. If you have a dog or cat and are allergic to dog or cats, consider removing the dog or cat from the home. If you have a dog or cat but don't want to find it a new home, or if your family wants a pet even though someone in the household is allergic, here are some strategies that may help keep symptoms at bay:  Keep the pet out of your bedroom and restrict it to  only a few rooms. Be advised that keeping the dog or cat in only one room will not limit the allergens to that room. Don't pet, hug or kiss the dog or cat; if you do, wash your hands with soap and water . High-efficiency particulate air (HEPA) cleaners run continuously in a bedroom or living room can reduce allergen levels over time. Regular use of a high-efficiency vacuum cleaner or a central vacuum can reduce allergen levels. Giving your dog or cat a bath at least once a week can reduce airborne allergen.  Control of Mold Allergen Mold and fungi can grow on a variety of surfaces provided certain temperature and moisture conditions exist.  Outdoor molds grow on plants, decaying vegetation and soil.  The major outdoor mold, Alternaria and Cladosporium, are found in very high numbers during hot and dry conditions.  Generally, a late Summer - Fall peak is seen for common outdoor fungal spores.  Rain will temporarily lower outdoor mold spore count, but counts rise rapidly when the rainy period ends.  The most important indoor molds are Aspergillus and Penicillium.  Dark, humid and poorly ventilated basements are ideal sites for mold growth.  The next most common sites of mold growth are the bathroom and the kitchen.  Outdoor [employer] Use air conditioning and keep windows closed Avoid exposure to decaying vegetation. Avoid leaf raking. Avoid grain handling. Consider wearing a face mask if working in moldy areas.  Indoor Mold Control Maintain humidity below 50%. Clean washable surfaces with 5% bleach solution. Remove sources e.g. Contaminated carpets.   Control of Dust Mite Allergen Dust mites play a major role in allergic asthma and rhinitis. They occur in environments with high humidity wherever human skin is found. Dust mites absorb humidity from the atmosphere (ie, they do not drink) and feed on organic matter (including shed human and animal skin). Dust mites are a microscopic type of  insect that you cannot see with the naked eye. High levels of dust mites have been detected from mattresses, pillows, carpets, upholstered furniture, bed covers, clothes, soft toys and any woven material. The principal allergen of the dust mite is found in its feces. A gram of dust may contain 1,000 mites and 250,000 fecal particles. Mite antigen is easily measured in the air during house cleaning activities. Dust mites do not bite and do not cause harm to humans, other than by triggering allergies/asthma.  Ways to decrease your exposure to dust mites in your home:  1. Encase mattresses, box springs and pillows with a mite-impermeable barrier or cover  2. Wash sheets, blankets and drapes weekly in hot water  (130 F) with detergent and dry them in a dryer on the hot setting.  3. Have the room cleaned frequently with a vacuum cleaner and a damp dust-mop. For carpeting or rugs, vacuuming with a vacuum cleaner equipped with a high-efficiency particulate air (HEPA) filter. The dust mite allergic individual should not be in a room which  is being cleaned and should wait 1 hour after cleaning before going into the room.  4. Do not sleep on upholstered furniture (eg, couches).  5. If possible removing carpeting, upholstered furniture and drapery from the home is ideal. Horizontal blinds should be eliminated in the rooms where the person spends the most time (bedroom, study, television room). Washable vinyl, roller-type shades are optimal.  6. Remove all non-washable stuffed toys from the bedroom. Wash stuffed toys weekly like sheets and blankets above.  7. Reduce indoor humidity to less than 50%. Inexpensive humidity monitors can be purchased at most hardware stores. Do not use a humidifier as can make the problem worse and are not recommended.

## 2023-12-09 ENCOUNTER — Ambulatory Visit: Admitting: Family

## 2023-12-09 ENCOUNTER — Other Ambulatory Visit: Payer: Self-pay

## 2023-12-09 ENCOUNTER — Other Ambulatory Visit: Payer: Self-pay | Admitting: Family

## 2023-12-09 ENCOUNTER — Encounter: Payer: Self-pay | Admitting: Family

## 2023-12-09 ENCOUNTER — Ambulatory Visit

## 2023-12-09 VITALS — BP 110/60 | HR 89 | Temp 98.6°F

## 2023-12-09 DIAGNOSIS — H1013 Acute atopic conjunctivitis, bilateral: Secondary | ICD-10-CM

## 2023-12-09 DIAGNOSIS — T7800XD Anaphylactic reaction due to unspecified food, subsequent encounter: Secondary | ICD-10-CM

## 2023-12-09 DIAGNOSIS — L2089 Other atopic dermatitis: Secondary | ICD-10-CM

## 2023-12-09 DIAGNOSIS — J4540 Moderate persistent asthma, uncomplicated: Secondary | ICD-10-CM

## 2023-12-09 DIAGNOSIS — J310 Chronic rhinitis: Secondary | ICD-10-CM

## 2023-12-09 DIAGNOSIS — H109 Unspecified conjunctivitis: Secondary | ICD-10-CM

## 2023-12-09 MED ORDER — BUDESONIDE-FORMOTEROL FUMARATE 160-4.5 MCG/ACT IN AERO: INHALATION_SPRAY | RESPIRATORY_TRACT | 5 refills | Status: AC

## 2023-12-09 MED ORDER — OLOPATADINE HCL 0.2 % OP SOLN: OPHTHALMIC | 5 refills | Status: AC

## 2023-12-09 MED ORDER — MUPIROCIN 2 % EX OINT: TOPICAL_OINTMENT | CUTANEOUS | 0 refills | Status: AC

## 2023-12-09 MED ORDER — SPIRIVA RESPIMAT 1.25 MCG/ACT IN AERS: 2.0000 | INHALATION_SPRAY | Freq: Every day | RESPIRATORY_TRACT | 5 refills | Status: AC

## 2023-12-09 NOTE — Progress Notes (Signed)
 [address] [place] [address] Dept: [phone]  FOLLOW UP NOTE  Patient ID: Ariel Hernandez, female    DOB: 28-Aug-2000  Age: 23 y.o. MRN: [id number] Date of Office Visit: 12/09/2023  Assessment  Chief Complaint: Follow-up  HPI Ariel Hernandez is a 23 year old female who presents today for follow-up of allergic rhinitis, asthma, eczema, and food allergies.  She was last seen on September 24, 2023 by Dr. Jeneal.  She denies any new diagnosis or surgery since her last office visit.  Allergic rhinitis: She reports clear rhinorrhea, nasal congestion, and sore throat.  She is not sure if she has postnasal drip.  She denies fever or chills.  She describes the sore throat as more of an irritation.  She mentions that she usually feels unwell with weather changes will usually get sick in the winter.  She has not been treated for any sinus infections since we last saw her.  She is currently taking Xyzal  at night and Benadryl  in the morning.  She reports that Benadryl  helps with her itchy eyes.  She is also using Flonase  nasal spray daily.  She reports that she does not use the olopatadine  0.2% eyedrops because the Benadryl  helps with itchy watery eyes.  She does mention that her roommate has 2 dogs with one of them being hypoallergenic, but she is allergic to them.  Also,she has had off and on ear pain.  She has previously had PE tubes in the past and she has had a hole that she thinks is closed up.  Asthma: She reports that her asthma has not been good since she ran out of the sample of Breztri .  When she was on Breztri  it helped she was able to walk and exercise.  Unfortunately her insurance did not cover Breztri .  She was previously on Symbicort  and it was not as helpful.  She does not think she has ever tried Spiriva  Respimat.  She mentions that she does not do well with the dry powder inhalers.  She does not feel like she gets it in her lungs.  She continues to take Singulair  10 mg daily.   She does not really have a lot of coughing or wheezing, but maybe some.  She does have some tightness in her chest and shortness of breath.  She has maybe had nocturnal awakenings once or twice since her last office visit.  Since her last office visit she has not required any systemic steroids or made any trips to the emergency room or urgent care due to breathing problems.  She has used her albuterol  once a day for this past week since being out of a controller inhaler.  Eczema: She continues to receive Dupixent  injections per protocol.  She reports that her family members can tell a difference in her skin and she also reports that it helps her lungs.  She is not able to use the Opzelura  that Dr. Jeneal gave her at the last time because it made her face red and painful.  She has not tried MetroGel yet that Dr. Jeneal recommended at the last office visit.  She does mention that Dr. Jeneal spoke with her that the bad breakouts on her face could be due to a side effect of Dupixent .  She does have Protopic , triamcinolone , and clobetasol  to use as needed.  Food allergies: She continues to avoid fish, shellfish, and coleslaw in her diet without any accidental ingestion or use of her epinephrine  autoinjector device.  Drug Allergies:  Allergies  Allergen Reactions   Dexmethylphenidate Hcl Shortness Of Breath   Fish Protein-Containing Drug Products Shortness Of Breath   Grass Pollen(K-O-R-T-Swt Vern) Itching and Other (See Comments)    Animal dander   Hydrocortisone Rash   Focalin [Dexmethylphenidate] Other (See Comments)    Reaction unknown   Bee Pollen Itching and Rash   Milk (Cow) Diarrhea, Nausea And Vomiting and Palpitations    Review of Systems: Negative except as per HPI   Physical Exam: BP 110/60   Pulse 89   Temp 98.6 F (37 C)   SpO2 99%    Physical Exam Constitutional:      Appearance: Normal appearance.  HENT:     Head: Normocephalic and atraumatic.     Comments: Pharynx  normal, eyes normal, ears: Right ear normal, left ear: Scarring noted on tympanic membrane, nose: Bilateral lower turbinates mildly edematous with no drainage noted    Right Ear: Tympanic membrane, ear canal and external ear normal.     Left Ear: Ear canal and external ear normal.     Mouth/Throat:     Mouth: Mucous membranes are moist.     Pharynx: Oropharynx is clear.  Eyes:     Conjunctiva/sclera: Conjunctivae normal.  Cardiovascular:     Rate and Rhythm: Regular rhythm.     Heart sounds: Normal heart sounds.  Pulmonary:     Effort: Pulmonary effort is normal.     Breath sounds: Normal breath sounds.     Comments: Lungs clear to auscultation Musculoskeletal:     Cervical back: Neck supple.  Skin:    General: Skin is warm.     Comments: Erythematous area with excoriation marks noted on right forearm.  Erythematous papules noted around nasolabial fold  Neurological:     Mental Status: She is alert and oriented to person, place, and time.  Psychiatric:        Mood and Affect: Mood normal.        Behavior: Behavior normal.        Thought Content: Thought content normal.        Judgment: Judgment normal.     Diagnostics: FVC 3.48 L (123%), FEV1 2.79 L (112%), FEV1/FVC 0.80.  Spirometry indicates normal spirometry.  Assessment and Plan: 1. Other atopic dermatitis   2. Not well controlled moderate persistent asthma   3. 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  Rinse mouth out out after work    Dispense:  1 each    Refill:  5   Tiotropium Bromide (SPIRIVA RESPIMAT) 1.25 MCG/ACT AERS    Sig: Inhale 2 puffs into the lungs  daily.    Dispense:  4 g    Refill:  5   mupirocin  ointment (BACTROBAN ) 2 %    Sig: Use 1 application twice a day for area under nose for 7 days    Dispense:  22 g    Refill:  0    Patient Instructions  Asthma- insurance does not cover Breztri. Does not do well with dry powder  inhalers Start Symbicort  160/4.5 mcg 2 puffs twice a day with spacer to help prevent cough and wheeze. Rinse mouth out after Start Spiriva  Respimat 1.25 mcg 2 puffs once a day to help patient cough and wheeze.  Demonstration given Continue montelukast  10 mg once a day to prevent cough or wheeze Continue albuterol  2 puffs once every 4 hours if needed for cough or wheeze You may use albuterol  2 puffs 5-15 minutes before activity to decrease cough or wheeze   Allergic rhinitis Ontinue allergen avoidance measures directed toward pollen, mold, dust mite, cat, and dog as listed below Continue Xyzal  5 mg once a day if needed for runny nose or itch Continue Flonase  2 sprays in each nostril once a day if needed for stuffy nose.aasal Consider allergen immunotherapy once your asthma is well-controlled Stop Benadryl  daily due to increased risk of dementia. Use Pataday  (olopatadine ) as below for itchy eyes  Allergic conjunctivitis Start Pataday  (olopatadine ) 0.2% using 1 drop in each eye once a day as needed for itchy watery eyes  Atopic dermatitis Continue a twice a day moisturizing routine Continue Protopic  to reddened itchy areas up to twice a day if needed Continue triamcinolone  to stubborn red itchy areas under [place] your face up to twice a day if needed.  Do not use this medication for longer than 2 weeks in a row For really stubborn areas underneath your face, use clobetasol  once or twice a day if needed.  Do not use this medication longer than 10 days in a row Continue Dupixent  300 mg once every 2 weeks for control of atopic dermatitis  Rash Stop Opzulera due to reaction Called and spoke with Preet about  starting mupirocin  2% ointment using 1 application twice a day for 7 days to the area under her nose. If your symptoms re-occur, begin a journal of events that occurred for up to 6 hours before your symptoms began including foods and beverages consumed, soaps or perfumes you had contact with, and medications.   Food allergy Continue to avoid fish, shellfish, and coleslaw.  In case of an allergic reaction, take cetirizine  10 mg once every 12-24  hours, and if life-threatening symptoms occur, inject with EpiPen  0.3 mg. Consider skin testing to shellfish and possibly a food challenge if skin testing is negative once your breathing has returned to baseline  If your sore throat persist recommend going to urgent care where they can check for strep throat. Call the clinic if this treatment plan is not working well for you.  Follow up in 4 to 6 weeks or sooner if needed.  Reducing Pollen Exposure The American Academy of Allergy, Asthma and Immunology suggests the following steps to reduce your exposure to pollen during allergy seasons. Do not hang sheets or clothing out to dry; pollen may collect on these items. Do not mow lawns or spend time around freshly cut grass; mowing stirs up pollen. Keep windows closed at night.  Keep car windows closed while driving. Minimize morning activities outdoors, a time when pollen counts are usually at their highest. Stay indoors as much as possible when pollen counts or humidity is high and on windy days when pollen tends to remain in the air longer. Use air conditioning when possible.  Many air conditioners have filters that trap the pollen spores. Use a HEPA room air filter to remove pollen form the indoor air you breathe.  Control of Dog or Cat Allergen Avoidance is the best way to manage a dog or cat  allergy. If you have a dog or cat and are allergic to dog or cats, consider removing the dog or cat from the home. If you have a dog or cat but don't want to find  it a new home, or if your family wants a pet even though someone in the household is allergic, here are some strategies that may help keep symptoms at bay:  Keep the pet out of your bedroom and restrict it to only a few rooms. Be advised that keeping the dog or cat in only one room will not limit the allergens to that room. Don't pet, hug or kiss the dog or cat; if you do, wash your hands with soap and water . High-efficiency particulate air (HEPA) cleaners run continuously in a bedroom or living room can reduce allergen levels over time. Regular use of a high-efficiency vacuum cleaner or a central vacuum can reduce allergen levels. Giving your dog or cat a bath at least once a week can reduce airborne allergen.  Control of Mold Allergen Mold and fungi can grow on a variety of surfaces provided certain temperature and moisture conditions exist.  Outdoor molds grow on plants, decaying vegetation and soil.  The major outdoor mold, Alternaria and Cladosporium, are found in very high numbers during hot and dry conditions.  Generally, a late Summer - Fall peak is seen for common outdoor fungal spores.  Rain will temporarily lower outdoor mold spore count, but counts rise rapidly when the rainy period ends.  The most important indoor molds are Aspergillus and Penicillium.  Dark, humid and poorly ventilated basements are ideal sites for mold growth.  The next most common sites of mold growth are the bathroom and the kitchen.  Outdoor [employer] Use air conditioning and keep windows closed Avoid exposure to decaying vegetation. Avoid leaf raking. Avoid grain handling. Consider wearing a face mask if working in moldy areas.  Indoor Mold Control Maintain humidity below 50%. Clean washable surfaces with 5% bleach solution. Remove sources e.g. Contaminated carpets.   Control of Dust Mite Allergen Dust mites play a major role in allergic asthma and rhinitis. They occur in environments with high humidity  wherever human skin is found. Dust mites absorb humidity from the atmosphere (ie, they do not drink) and feed on organic matter (including shed human and animal skin). Dust mites are a microscopic type of insect that you cannot see with the naked eye. High levels of dust mites have been detected from mattresses, pillows, carpets, upholstered furniture, bed covers, clothes, soft toys and any woven material. The principal allergen of the dust mite is found in its feces. A gram of dust may contain 1,000 mites and 250,000 fecal particles. Mite antigen is easily measured in the air during house cleaning activities. Dust mites do not bite and do not cause harm to humans, other than by triggering allergies/asthma.  Ways to decrease your exposure to dust mites in your home:  1. Encase mattresses, box springs and pillows with a mite-impermeable barrier or cover  2. Wash sheets, blankets and drapes weekly in hot water  (130 F) with detergent and dry them in a dryer on the hot setting.  3. Have the room cleaned frequently with a vacuum cleaner and a damp dust-mop. For carpeting or rugs, vacuuming with a vacuum cleaner equipped with a high-efficiency particulate air (HEPA) filter. The dust mite allergic individual should not be in a room which is being cleaned and should wait 1 hour after cleaning before going into  the room.  4. Do not sleep on upholstered furniture (eg, couches).  5. If possible removing carpeting, upholstered furniture and drapery from the home is ideal. Horizontal blinds should be eliminated in the rooms where the person spends the most time (bedroom, study, television room). Washable vinyl, roller-type shades are optimal.  6. Remove all non-washable stuffed toys from the bedroom. Wash stuffed toys weekly like sheets and blankets above.  7. Reduce indoor humidity to less than 50%. Inexpensive humidity monitors can be purchased at most hardware stores. Do not use a humidifier as can make the  problem worse and are not recommended.   Return in about 6 weeks (around 01/20/2024), or if symptoms worsen or fail to improve.    Thank you for the opportunity to care for this patient.  Please do not hesitate to contact me with questions.  Wanda Craze, FNP [facility] 

## 2023-12-11 NOTE — Addendum Note (Signed)
 Addended by: CELESTIA BIBBER D on: 12/11/2023 02:37 PM   Modules accepted: Orders

## 2023-12-14 NOTE — Telephone Encounter (Signed)
 Can we please try to Breztri  covered since Spiriva  is not covered. 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 Can we give her some Breztri  samples to take  2 puffs twice a day with spacer while we deal with her insurance. While she is on the Breztri  samples do not use Symbicort .

## 2023-12-15 ENCOUNTER — Other Ambulatory Visit (HOSPITAL_COMMUNITY): Payer: Self-pay | Admitting: Student in an Organized Health Care Education/Training Program

## 2023-12-15 ENCOUNTER — Other Ambulatory Visit (HOSPITAL_COMMUNITY): Payer: Self-pay

## 2023-12-15 DIAGNOSIS — F4310 Post-traumatic stress disorder, unspecified: Secondary | ICD-10-CM

## 2023-12-15 DIAGNOSIS — F411 Generalized anxiety disorder: Secondary | ICD-10-CM

## 2023-12-15 DIAGNOSIS — F79 Unspecified intellectual disabilities: Secondary | ICD-10-CM

## 2023-12-15 DIAGNOSIS — F605 Obsessive-compulsive personality disorder: Secondary | ICD-10-CM

## 2023-12-15 DIAGNOSIS — F39 Unspecified mood [affective] disorder: Secondary | ICD-10-CM

## 2023-12-15 NOTE — Telephone Encounter (Signed)
 Incruse is a dry powder inhaler and she has a milk allergy so that medication is not a good choice for her. Please see if we can get Breztri  approved or if we can get Spirivia Respimat added to her Symbicort 

## 2023-12-16 ENCOUNTER — Telehealth: Payer: Self-pay

## 2023-12-16 NOTE — Telephone Encounter (Signed)
*  AA  Pharmacy Patient Advocate Encounter   Received notification from RX Request Messages that prior authorization for Breztri  Aerosphere is required/requested.   Insurance verification completed.   The patient is insured through [facility].   Per test claim: PA required; PA started via CoverMyMeds. KEY [id number] . Waiting for clinical questions to populate.

## 2023-12-23 ENCOUNTER — Telehealth: Payer: Self-pay | Admitting: Family

## 2023-12-23 ENCOUNTER — Other Ambulatory Visit: Payer: Self-pay

## 2023-12-23 ENCOUNTER — Ambulatory Visit

## 2023-12-23 DIAGNOSIS — L2089 Other atopic dermatitis: Secondary | ICD-10-CM

## 2023-12-23 NOTE — Telephone Encounter (Signed)
 Your request has been approved Authorization Expiration12/03/2024

## 2023-12-23 NOTE — Telephone Encounter (Signed)
 No response from plan, new PA created.  Key: [id number]  Submitted and pending determination

## 2023-12-24 NOTE — Telephone Encounter (Signed)
 Ariel Hernandez, I didn't see anything in your message sent to the poole, was there anything specific that we needed to address?

## 2023-12-28 ENCOUNTER — Other Ambulatory Visit: Payer: Self-pay

## 2023-12-28 NOTE — Progress Notes (Signed)
 Specialty Pharmacy Refill Coordination Note  Ariel Hernandez is a 23 y.o. female assessed today regarding refills of clinic administered specialty medication(s) Dupilumab  (Dupixent )   Clinic requested Courier to Provider Office   Delivery date: 01/04/24   Verified address: [address] [place] [address]   Medication will be filled on: 01/01/24   Copay: $0.00 Appointment: 12.18.25

## 2023-12-29 NOTE — Telephone Encounter (Signed)
 Prior Authorization for Breztri  has been approved.

## 2024-01-01 ENCOUNTER — Other Ambulatory Visit: Payer: Self-pay

## 2024-01-07 ENCOUNTER — Ambulatory Visit

## 2024-01-16 ENCOUNTER — Other Ambulatory Visit (HOSPITAL_COMMUNITY): Payer: Self-pay | Admitting: Student in an Organized Health Care Education/Training Program

## 2024-01-20 ENCOUNTER — Other Ambulatory Visit: Payer: Self-pay

## 2024-01-25 ENCOUNTER — Other Ambulatory Visit: Payer: Self-pay

## 2024-01-25 ENCOUNTER — Telehealth: Payer: Self-pay

## 2024-01-25 NOTE — Telephone Encounter (Signed)
 Please clarify doses in office, last sent 12/15 and never received due to appt cancellation. Patient states office called and said there was no medication at the office.

## 2024-01-25 NOTE — Telephone Encounter (Signed)
 She has 1 box in office that is unopened.

## 2024-02-01 ENCOUNTER — Ambulatory Visit (INDEPENDENT_AMBULATORY_CARE_PROVIDER_SITE_OTHER): Admitting: Clinical

## 2024-02-01 DIAGNOSIS — F411 Generalized anxiety disorder: Secondary | ICD-10-CM | POA: Diagnosis not present

## 2024-02-01 NOTE — Progress Notes (Signed)
" ° °  THERAPIST PROGRESS NOTE Virtual Visit via Video Note  I connected with Ariel Hernandez on 02/01/2024 at  4:00 PM EST by a video enabled telemedicine application and verified that I am speaking with the correct person using two identifiers.  Location: Patient: home Provider: office   I discussed the limitations of evaluation and management by telemedicine and the availability of in person appointments. The patient expressed understanding and agreed to proceed.   Follow Up Instructions: I discussed the assessment and treatment plan with the patient. The patient was provided an opportunity to ask questions and all were answered. The patient agreed with the plan and demonstrated an understanding of the instructions.   The patient was advised to call back or seek an in-person evaluation if the symptoms worsen or if the condition fails to improve as anticipated.    Session Time: 40 min  Participation Level: Active  Behavioral Response: CasualAlertEuthymic  Type of Therapy: Individual Therapy  Treatment Goals addressed: client will engage in at least 80% of scheduled individual psychotherapy sessions  ProgressTowards Goals: Progressing  Interventions: CBT  Summary:  Ariel Hernandez is a 24 y.o. female who presents for the scheduled appointment oriented times five, appropriately dressed and friendly. Client denied hallucinations and delusions. Client reported she has been fairly okay. Client reported she spent the holiday by herself. Client reported since her dad had his foot amputated there had been issues between he and her mom. Client reported her mom wont return her phone calls either. Client reported she is talking to her dad by phone. Client reported things are okay where she lives but she is trying to look for another apartment because of the dog allergy she has. Client reported her record is affecting her being accepted to other places for work and housing. Client reported  she has been thinking about trying to work but she does not want to mess up having her benefits. Client reported the girl in her neighborhood and her brother recently saw her out walking and tried to talk to her. Client reported she does not want to relapse by talking to them because they were the reason she hospitalized herself last year. Evidence of progress towards goal:  client reported 1 positive of keeping boundaries.   Suicidal/Homicidal: Nowithout intent/plan  Therapist Response:  Therapist began the appointment asking the client how she has been doing. Therapist engaged with active listening and positive emotional support. Therapist used cbt to engage and give her time to discuss stressors and changes with family, her daily life and future goals. Therapist used cbt to reinforce her use of boundaries. Therapist used CBT ask the client to identify her progress with frequency of use with coping skills with continued practice in her daily activity.      Plan: Return again in 3 weeks.  Diagnosis: GAD  Collaboration of Care: Patient refused AEB none requested by the client.  Patient/Guardian was advised Release of Information must be obtained prior to any record release in order to collaborate their care with an outside provider. Patient/Guardian was advised if they have not already done so to contact the registration department to sign all necessary forms in order for us  to release information regarding their care.   Consent: Patient/Guardian gives verbal consent for treatment and assignment of benefits for services provided during this visit. Patient/Guardian expressed understanding and agreed to proceed.   Cavion Faiola Y Jeydan Barner, LCSW 02/01/2024  "

## 2024-02-10 ENCOUNTER — Ambulatory Visit

## 2024-02-10 DIAGNOSIS — L2089 Other atopic dermatitis: Secondary | ICD-10-CM | POA: Diagnosis not present

## 2024-02-15 NOTE — Patient Instructions (Incomplete)
 Asthma-  Does not do well with dry powder inhalers Continue Breztri  2 inhalations twice a day with spacer. Rinse mouth out after Continue montelukast  10 mg once a day to prevent cough or wheeze Continue albuterol  2 puffs once every 4 hours if needed for cough or wheeze You may use albuterol  2 puffs 5-15 minutes before activity to decrease cough or wheeze   Asthma control goals:  Full participation in all desired activities (may need albuterol  before activity) Albuterol  use two time or less a week on average (not counting use with activity) Cough interfering with sleep two time or less a month Oral steroids no more than once a year No hospitalizations   Allergic rhinitis Ontinue allergen avoidance measures directed toward pollen, mold, dust mite, cat, and dog as listed below Continue Xyzal  5 mg once a day if needed for runny nose or itch Continue Flonase  2 sprays in each nostril once a day if needed for stuffy nose.aasal Consider allergen immunotherapy once your asthma is well-controlled  Allergic conjunctivitis Continue Pataday  (olopatadine ) 0.2% using 1 drop in each eye once a day as needed for itchy watery eyes  Atopic dermatitis Continue a twice a day moisturizing routine Continue Protopic  to reddened itchy areas up to twice a day if needed Continue triamcinolone  to stubborn red itchy areas under [place] your face up to twice a day if needed.  Do not use this medication for longer than 2 weeks in a row For really stubborn areas underneath your face, use clobetasol  once or twice a day if needed.  Do not use this medication longer than 10 days in a row Continue Dupixent  300 mg once every 2 weeks for control of atopic dermatitis  Rash Tried  Opzulera in past, but stopped due to reaction If your symptoms re-occur, begin a journal of events that occurred for up to 6 hours before your symptoms began including foods and beverages consumed, soaps or perfumes you had contact with, and  medications.   Food allergy Continue to avoid fish, shellfish, and coleslaw.  In case of an allergic reaction, take cetirizine  10 mg once every 12-24  hours, and if life-threatening symptoms occur, inject with EpiPen  0.3 mg. Consider skin testing to shellfish and possibly a food challenge if skin testing is negative once your breathing has returned to baseline  If your sore throat persist recommend going to urgent care where they can check for strep throat. Call the clinic if this treatment plan is not working well for you.  Follow up in months or sooner if needed.  Reducing Pollen Exposure The [employer] suggests the following steps to reduce your exposure to pollen during allergy seasons. Do not hang sheets or clothing out to dry; pollen may collect on these items. Do not mow lawns or spend time around freshly cut grass; mowing stirs up pollen. Keep windows closed at night.  Keep car windows closed while driving. Minimize morning activities outdoors, a time when pollen counts are usually at their highest. Stay indoors as much as possible when pollen counts or humidity is high and on windy days when pollen tends to remain in the air longer. Use air conditioning when possible.  Many air conditioners have filters that trap the pollen spores. Use a HEPA room air filter to remove pollen form the indoor air you breathe.  Control of Dog or Cat Allergen Avoidance is the best way to manage a dog or cat allergy. If you have a dog  or cat and are allergic to dog or cats, consider removing the dog or cat from the home. If you have a dog or cat but dont want to find it a new home, or if your family wants a pet even though someone in the household is allergic, here are some strategies that may help keep symptoms at bay:  Keep the pet out of your bedroom and restrict it to only a few rooms. Be advised that keeping the dog or cat in only one room will not limit the  allergens to that room. Dont pet, hug or kiss the dog or cat; if you do, wash your hands with soap and water . High-efficiency particulate air (HEPA) cleaners run continuously in a bedroom or living room can reduce allergen levels over time. Regular use of a high-efficiency vacuum cleaner or a central vacuum can reduce allergen levels. Giving your dog or cat a bath at least once a week can reduce airborne allergen.  Control of Mold Allergen Mold and fungi can grow on a variety of surfaces provided certain temperature and moisture conditions exist.  Outdoor molds grow on plants, decaying vegetation and soil.  The major outdoor mold, Alternaria and Cladosporium, are found in very high numbers during hot and dry conditions.  Generally, a late Summer - Fall peak is seen for common outdoor fungal spores.  Rain will temporarily lower outdoor mold spore count, but counts rise rapidly when the rainy period ends.  The most important indoor molds are Aspergillus and Penicillium.  Dark, humid and poorly ventilated basements are ideal sites for mold growth.  The next most common sites of mold growth are the bathroom and the kitchen.  Outdoor [employer] Use air conditioning and keep windows closed Avoid exposure to decaying vegetation. Avoid leaf raking. Avoid grain handling. Consider wearing a face mask if working in moldy areas.  Indoor Mold Control Maintain humidity below 50%. Clean washable surfaces with 5% bleach solution. Remove sources e.g. Contaminated carpets.   Control of Dust Mite Allergen Dust mites play a major role in allergic asthma and rhinitis. They occur in environments with high humidity wherever human skin is found. Dust mites absorb humidity from the atmosphere (ie, they do not drink) and feed on organic matter (including shed human and animal skin). Dust mites are a microscopic type of insect that you cannot see with the naked eye. High levels of dust mites have been detected from  mattresses, pillows, carpets, upholstered furniture, bed covers, clothes, soft toys and any woven material. The principal allergen of the dust mite is found in its feces. A gram of dust may contain 1,000 mites and 250,000 fecal particles. Mite antigen is easily measured in the air during house cleaning activities. Dust mites do not bite and do not cause harm to humans, other than by triggering allergies/asthma.  Ways to decrease your exposure to dust mites in your home:  1. Encase mattresses, box springs and pillows with a mite-impermeable barrier or cover  2. Wash sheets, blankets and drapes weekly in hot water  (130 F) with detergent and dry them in a dryer on the hot setting.  3. Have the room cleaned frequently with a vacuum cleaner and a damp dust-mop. For carpeting or rugs, vacuuming with a vacuum cleaner equipped with a high-efficiency particulate air (HEPA) filter. The dust mite allergic individual should not be in a room which is being cleaned and should wait 1 hour after cleaning before going into the room.  4. Do not  sleep on upholstered furniture (eg, couches).  5. If possible removing carpeting, upholstered furniture and drapery from the home is ideal. Horizontal blinds should be eliminated in the rooms where the person spends the most time (bedroom, study, television room). Washable vinyl, roller-type shades are optimal.  6. Remove all non-washable stuffed toys from the bedroom. Wash stuffed toys weekly like sheets and blankets above.  7. Reduce indoor humidity to less than 50%. Inexpensive humidity monitors can be purchased at most hardware stores. Do not use a humidifier as can make the problem worse and are not recommended.

## 2024-02-16 ENCOUNTER — Ambulatory Visit: Admitting: Family

## 2024-02-18 ENCOUNTER — Other Ambulatory Visit: Payer: Self-pay

## 2024-02-24 ENCOUNTER — Ambulatory Visit

## 2024-02-24 ENCOUNTER — Ambulatory Visit (HOSPITAL_COMMUNITY): Admitting: Clinical

## 2024-02-26 ENCOUNTER — Other Ambulatory Visit: Payer: Self-pay

## 2024-03-07 ENCOUNTER — Ambulatory Visit (HOSPITAL_COMMUNITY): Admitting: Clinical

## 2024-03-10 ENCOUNTER — Encounter (HOSPITAL_COMMUNITY): Admitting: Student in an Organized Health Care Education/Training Program

## 2024-03-10 ENCOUNTER — Ambulatory Visit

## 2024-03-24 ENCOUNTER — Ambulatory Visit: Admitting: Allergy
